# Patient Record
Sex: Female | Born: 1985 | Race: White | Hispanic: No | Marital: Single | Smoking: Former smoker
Health system: Southern US, Community
[De-identification: ages and names within clinical notes are randomized; demographics above are authoritative.]

## PROBLEM LIST (undated history)

## (undated) DIAGNOSIS — E282 Polycystic ovarian syndrome: Secondary | ICD-10-CM

## (undated) DIAGNOSIS — N926 Irregular menstruation, unspecified: Secondary | ICD-10-CM

## (undated) HISTORY — DX: Irregular menstruation, unspecified: N92.6

## (undated) HISTORY — DX: Polycystic ovarian syndrome: E28.2

---
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
## 2008-04-07 ENCOUNTER — Emergency Department (HOSPITAL_COMMUNITY): Admission: EM | Admit: 2008-04-07 | Discharge: 2008-04-07 | Payer: Self-pay | Admitting: Emergency Medicine

## 2008-09-01 ENCOUNTER — Emergency Department (HOSPITAL_COMMUNITY): Admission: EM | Admit: 2008-09-01 | Discharge: 2008-09-01 | Payer: Self-pay | Admitting: Emergency Medicine

## 2009-03-20 ENCOUNTER — Emergency Department (HOSPITAL_BASED_OUTPATIENT_CLINIC_OR_DEPARTMENT_OTHER): Admission: EM | Admit: 2009-03-20 | Discharge: 2009-03-20 | Payer: Self-pay | Admitting: Emergency Medicine

## 2009-03-20 ENCOUNTER — Ambulatory Visit: Payer: Self-pay | Admitting: Radiology
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
## 2011-04-10 ENCOUNTER — Emergency Department (HOSPITAL_BASED_OUTPATIENT_CLINIC_OR_DEPARTMENT_OTHER)
Admission: EM | Admit: 2011-04-10 | Discharge: 2011-04-10 | Disposition: A | Discharge status: Home / Self Care | Payer: Commercial Managed Care - PPO | Attending: Emergency Medicine | Admitting: Emergency Medicine

## 2011-04-10 DIAGNOSIS — T63461A Toxic effect of venom of wasps, accidental (unintentional), initial encounter: Secondary | ICD-10-CM | POA: Insufficient documentation

## 2011-04-10 DIAGNOSIS — F17200 Nicotine dependence, unspecified, uncomplicated: Secondary | ICD-10-CM | POA: Insufficient documentation

## 2011-04-10 DIAGNOSIS — T6391XA Toxic effect of contact with unspecified venomous animal, accidental (unintentional), initial encounter: Secondary | ICD-10-CM | POA: Insufficient documentation

## 2011-07-28 LAB — PREGNANCY, URINE: Preg Test, Ur: NEGATIVE

## 2011-08-02 LAB — RAPID URINE DRUG SCREEN, HOSP PERFORMED
Barbiturates: NOT DETECTED
Benzodiazepines: NOT DETECTED

## 2011-08-02 LAB — ACETAMINOPHEN LEVEL
Acetaminophen (Tylenol), Serum: 43.6 — ABNORMAL HIGH
Acetaminophen (Tylenol), Serum: 44.3 — ABNORMAL HIGH

## 2011-08-02 LAB — ETHANOL: Alcohol, Ethyl (B): 195 — ABNORMAL HIGH

## 2011-08-02 LAB — POCT I-STAT, CHEM 8
Calcium, Ion: 1.09 — ABNORMAL LOW
Glucose, Bld: 104 — ABNORMAL HIGH
HCT: 46
Hemoglobin: 15.6 — ABNORMAL HIGH
TCO2: 21

## 2011-08-02 LAB — SALICYLATE LEVEL: Salicylate Lvl: 12.3

## 2011-08-02 LAB — POCT PREGNANCY, URINE: Preg Test, Ur: NEGATIVE

## 2011-08-19 ENCOUNTER — Emergency Department (INDEPENDENT_AMBULATORY_CARE_PROVIDER_SITE_OTHER): Payer: Commercial Managed Care - PPO

## 2011-08-19 ENCOUNTER — Encounter: Payer: Self-pay | Admitting: *Deleted

## 2011-08-19 ENCOUNTER — Emergency Department (HOSPITAL_BASED_OUTPATIENT_CLINIC_OR_DEPARTMENT_OTHER)
Admission: EM | Admit: 2011-08-19 | Discharge: 2011-08-20 | Disposition: A | Discharge status: Home / Self Care | Payer: Commercial Managed Care - PPO | Attending: Emergency Medicine | Admitting: Emergency Medicine

## 2011-08-19 DIAGNOSIS — R05 Cough: Secondary | ICD-10-CM | POA: Insufficient documentation

## 2011-08-19 DIAGNOSIS — J189 Pneumonia, unspecified organism: Secondary | ICD-10-CM | POA: Insufficient documentation

## 2011-08-19 DIAGNOSIS — R509 Fever, unspecified: Secondary | ICD-10-CM | POA: Insufficient documentation

## 2011-08-19 DIAGNOSIS — R0602 Shortness of breath: Secondary | ICD-10-CM

## 2011-08-19 DIAGNOSIS — R059 Cough, unspecified: Secondary | ICD-10-CM | POA: Insufficient documentation

## 2011-08-19 MED ORDER — ACETAMINOPHEN 500 MG PO TABS
1000.0000 mg | ORAL_TABLET | Freq: Once | ORAL | Status: AC
  Administered 2011-08-19: 1000 mg via ORAL
  Filled 2011-08-19: qty 2

## 2011-08-19 NOTE — ED Notes (Signed)
Pt presents to ED today with c/o fever and cough that started today.  Pt took robitussin and mucinex at home,  Pt last took ibuprefen aroun 4p today.

## 2011-08-20 MED ORDER — AZITHROMYCIN 250 MG PO TABS
500.0000 mg | ORAL_TABLET | Freq: Once | ORAL | Status: AC
  Administered 2011-08-20: 500 mg via ORAL
  Filled 2011-08-20: qty 2

## 2011-08-20 MED ORDER — IBUPROFEN 800 MG PO TABS
800.0000 mg | ORAL_TABLET | Freq: Once | ORAL | Status: AC
  Administered 2011-08-20: 800 mg via ORAL
  Filled 2011-08-20: qty 1

## 2011-08-20 MED ORDER — AZITHROMYCIN 250 MG PO TABS: 250.0000 mg | ORAL_TABLET | Freq: Every day | ORAL | Status: AC

## 2011-08-20 NOTE — ED Provider Notes (Signed)
History     CSN: [id number] Arrival date & time: 08/19/2011 11:20 PM   First MD Initiated Contact with Patient 08/20/11 0005      Chief Complaint  Patient presents with  . Fever  . Cough    (Consider location/radiation/quality/duration/timing/severity/associated sxs/prior treatment) Patient is a 25 y.o. female presenting with fever and cough. The history is provided by the patient.  Fever Primary symptoms of the febrile illness include fever, cough and myalgias. Primary symptoms do not include wheezing, shortness of breath, abdominal pain, nausea, vomiting, diarrhea, dysuria or rash. The current episode started yesterday. This is a new problem. The problem has been gradually worsening.  The fever began yesterday. The maximum temperature recorded prior to her arrival was 103 to 104 F. The temperature was taken by an oral thermometer.  The cough is dry and hacking.  Cough Associated symptoms include myalgias. Pertinent negatives include no shortness of breath and no wheezing.    History reviewed. No pertinent past medical history.  History reviewed. No pertinent past surgical history.  No family history on file.  History  Substance Use Topics  . Smoking status: Never Smoker   . Smokeless tobacco: Not on file  . Alcohol Use: No    OB History    Grav Para Term Preterm Abortions TAB SAB Ect Mult Living                  Review of Systems  Constitutional: Positive for fever.  Respiratory: Positive for cough. Negative for shortness of breath and wheezing.   Gastrointestinal: Negative for nausea, vomiting, abdominal pain and diarrhea.  Genitourinary: Negative for dysuria.  Musculoskeletal: Positive for myalgias.  Skin: Negative for rash.  All other systems reviewed and are negative.    Allergies  Review of patient's allergies indicates no known allergies.  Home Medications   Current Outpatient Rx  Name Route Sig Dispense Refill  . AZITHROMYCIN 250 MG PO TABS Oral  Take 1 tablet (250 mg total) by mouth daily. 4 tablet 0    BP 108/54  Pulse 110  Temp(Src) 102.7 F (39.3 C) (Oral)  Resp 18  Ht 5\' 4"  (1.626 m)  Wt 200 lb (90.719 kg)  BMI 34.33 kg/m2  SpO2 95%  LMP 08/17/2011  Physical Exam  Nursing note and vitals reviewed. Constitutional: She is oriented to person, place, and time. She appears well-developed and well-nourished. No distress.  HENT:  Head: Normocephalic and atraumatic.  Eyes: EOM are normal. Pupils are equal, round, and reactive to light.  Cardiovascular: Regular rhythm, normal heart sounds and intact distal pulses.  Tachycardia present.  Exam reveals no friction rub.   No murmur heard. Pulmonary/Chest: Breath sounds normal. No respiratory distress. She has no wheezes. She has no rales. She exhibits no tenderness.  Abdominal: Soft. Bowel sounds are normal. She exhibits no distension. There is no tenderness. There is no rebound and no guarding.  Musculoskeletal: Normal range of motion. She exhibits no tenderness.       No edema  Neurological: She is alert and oriented to person, place, and time. No cranial nerve deficit.  Skin: Skin is warm and dry. No rash noted.  Psychiatric: She has a normal mood and affect. Her behavior is normal.    ED Course  Procedures (including critical care time)  Labs Reviewed - No data to display Dg Chest 2 View  08/20/2011  *RADIOLOGY REPORT*  Clinical Data: Cough.  Shortness of breath.  Fever.  CHEST - 2 VIEW  Comparison:  03/20/2009  Findings: Retrocardiac airspace opacity is suspicious for pneumonia.  Low lung volumes noted.  The right lung appears clear. Cardiac and mediastinal contours appear unremarkable.  IMPRESSION:  1.  Left lower lobe airspace opacity, compatible with pneumonia.  Original Report Authenticated By: Dellia Cloud, M.D.     1. Pneumonia       MDM   Pt with high fever and cough but stable VS and O2 in the mid 90's.  No sick contacts and no medical problems.  Pt  given tylenol and ibuprofen for fever which improved fever.  Tolerating po's and CXR showed PNA.  Pt given azithro and given f/u instructions.  Denies SOB.        Gwyneth Sprout, MD 08/20/11 [phone]

## 2012-03-29 IMAGING — CR DG CHEST 2V
2 series · 2 of 2 positions shown · non-contrast
Comparison: 03/20/2009

CLINICAL DATA: Cough.  Shortness of breath.  Fever.

CHEST - 2 VIEW

[w chest pa]
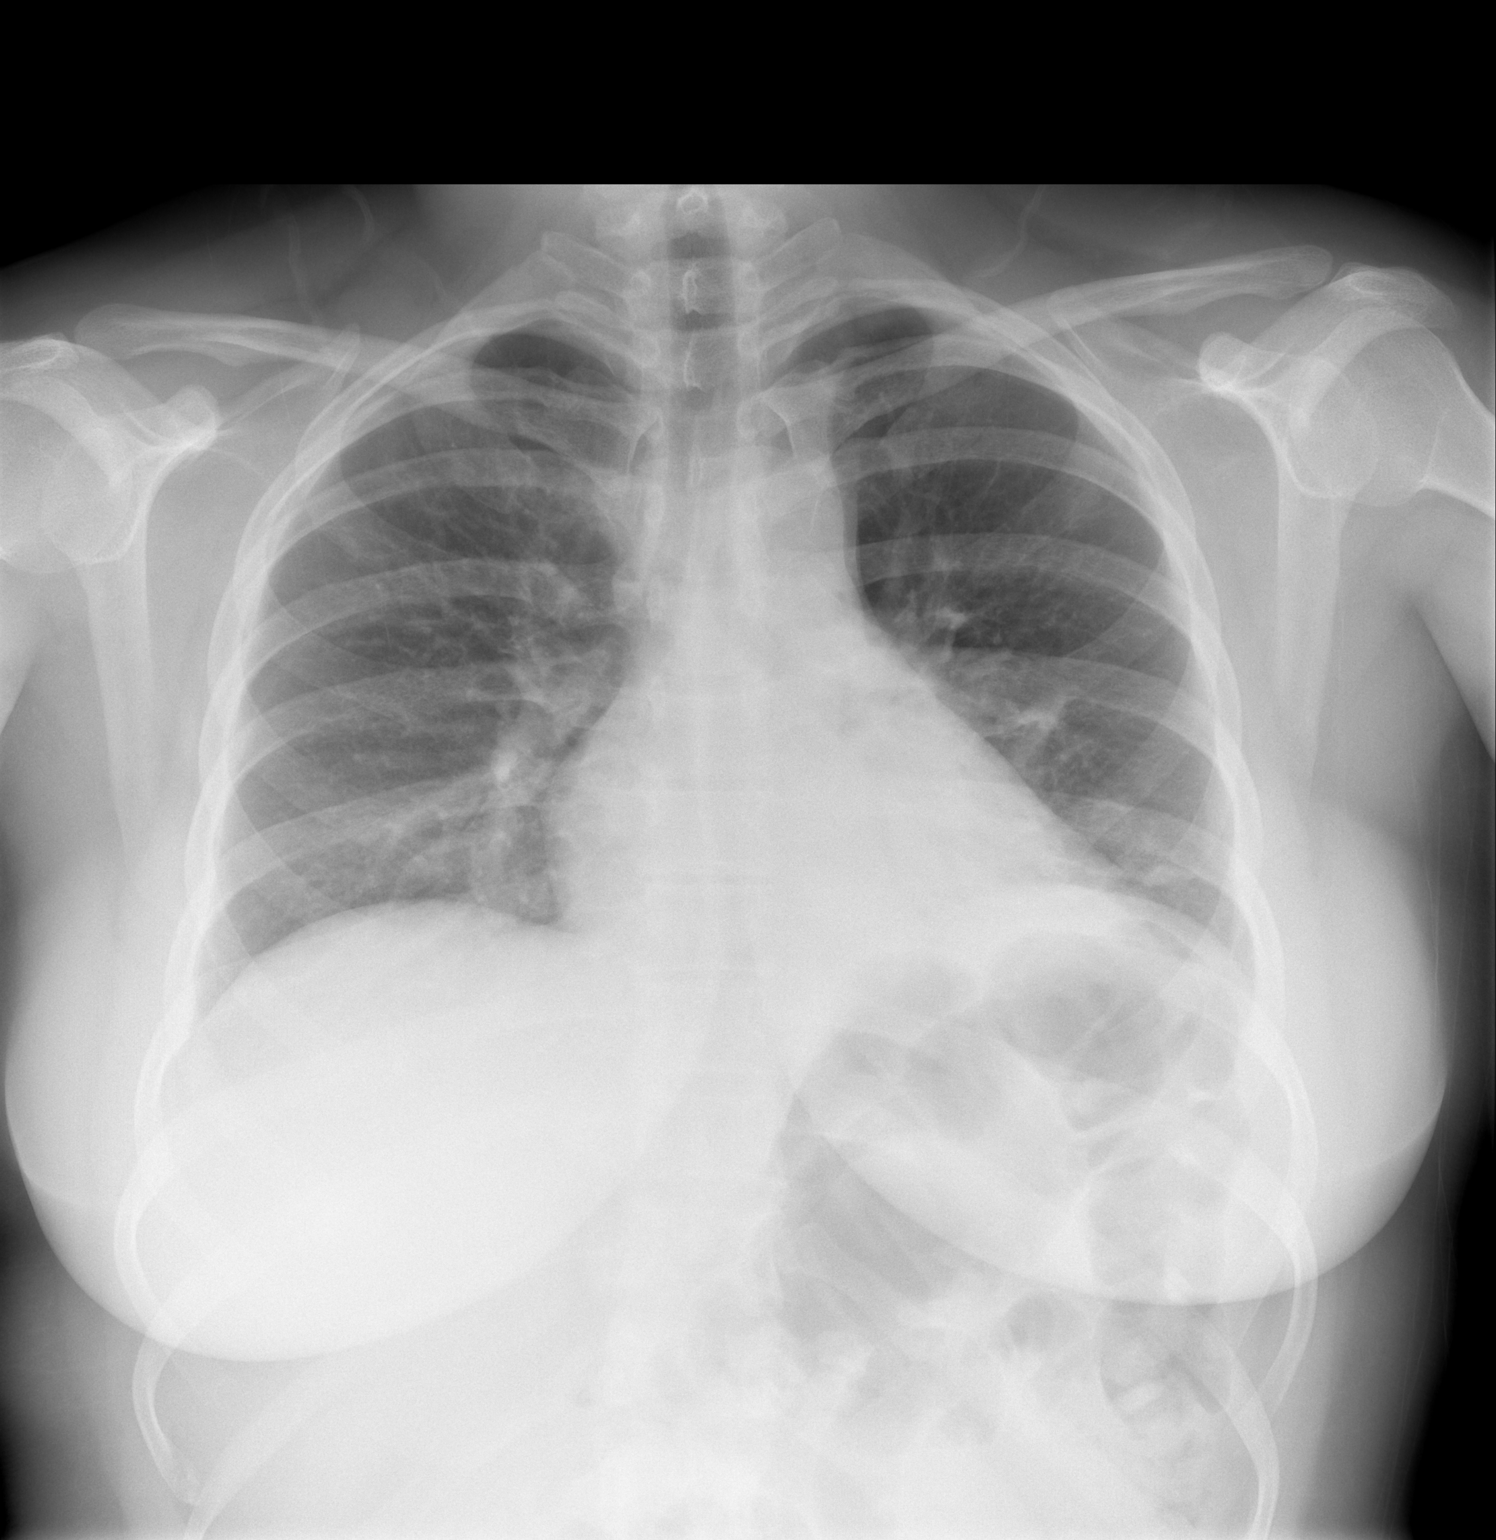

[w chest lat]
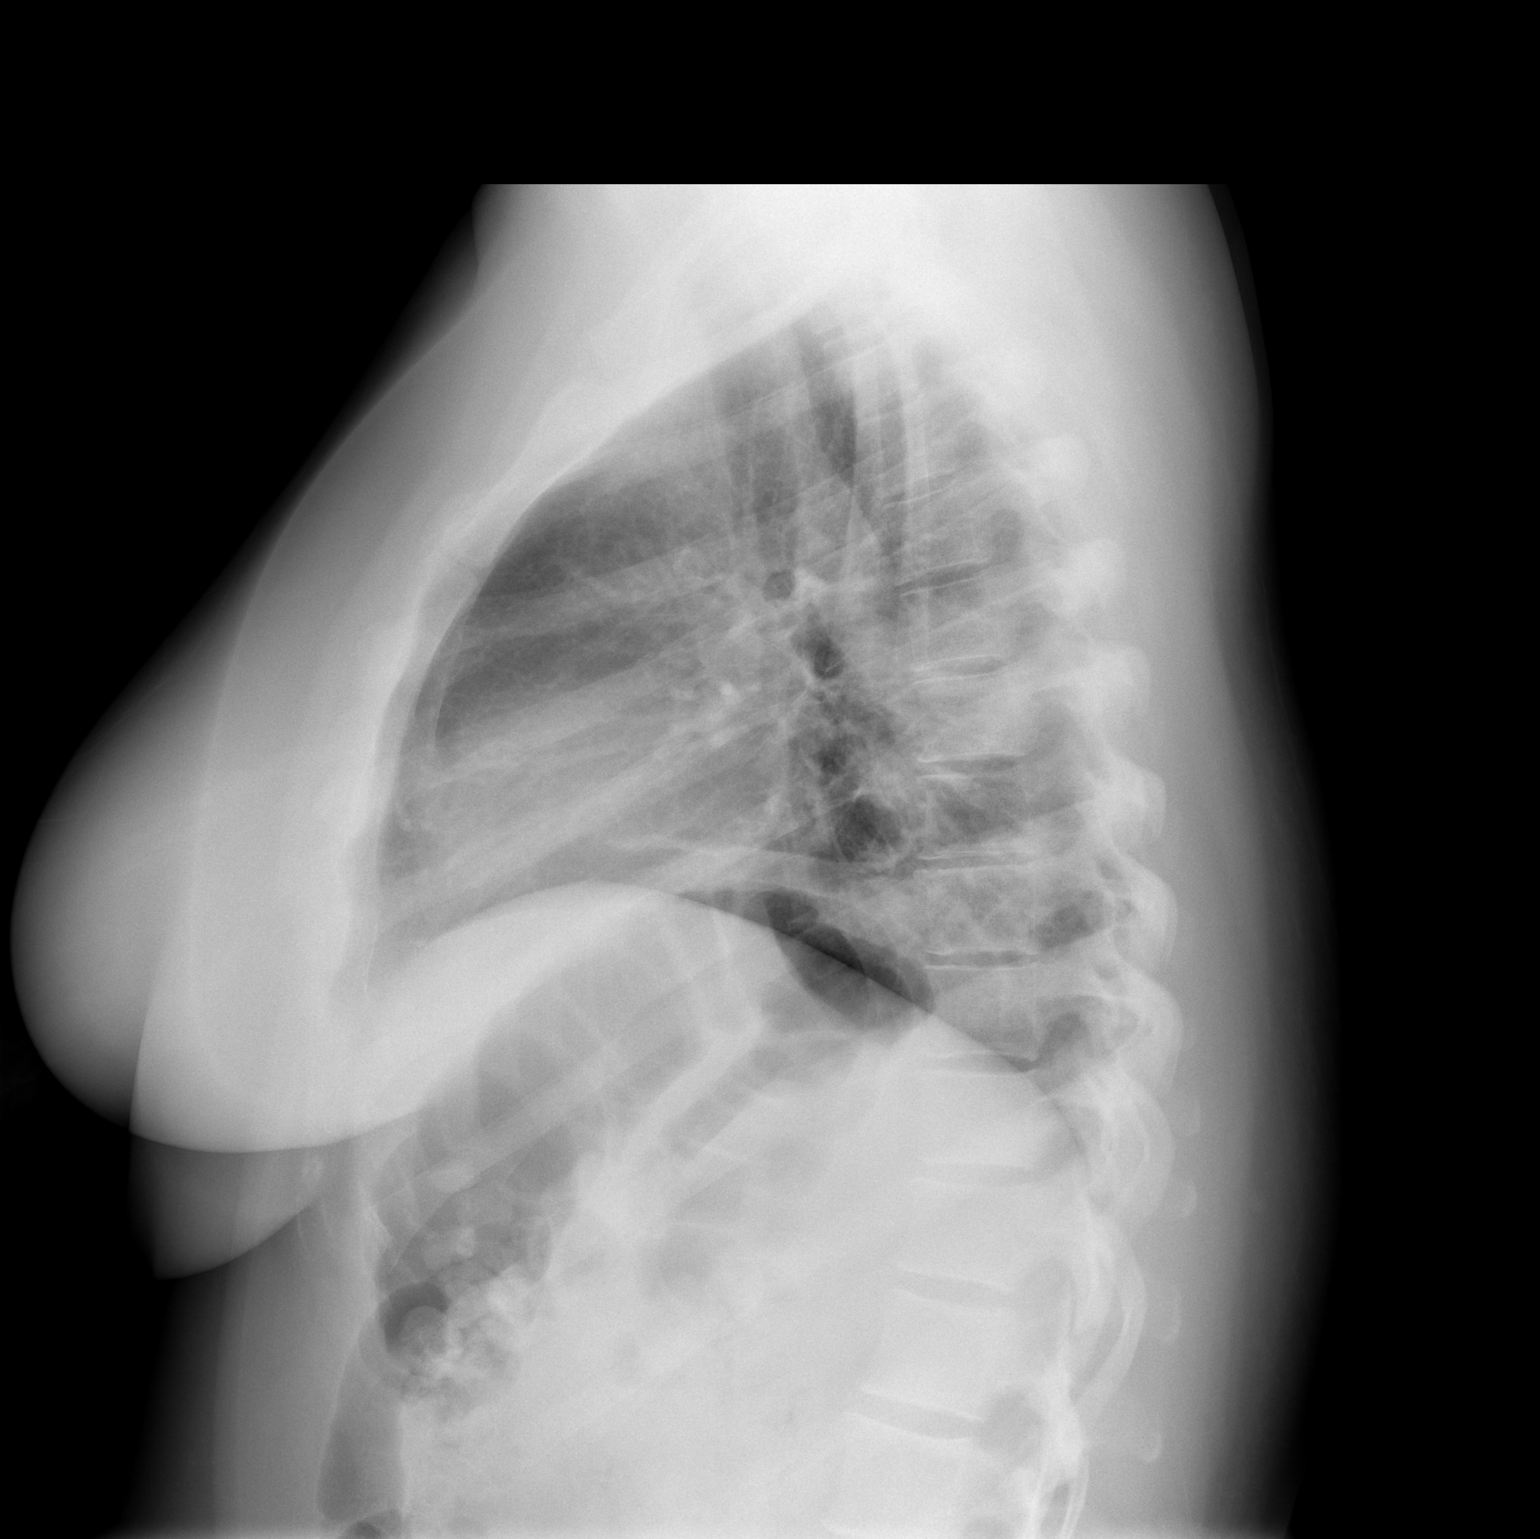

[2 of 2 positions shown; findings below may reference images not displayed]

FINDINGS: Retrocardiac airspace opacity is suspicious for
pneumonia.

Low lung volumes noted.  The right lung appears clear. Cardiac and
mediastinal contours appear unremarkable.
IMPRESSION: 1.  Left lower lobe airspace opacity, compatible with pneumonia.

## 2013-12-04 ENCOUNTER — Encounter (HOSPITAL_COMMUNITY): Payer: Self-pay | Admitting: Emergency Medicine

## 2013-12-04 ENCOUNTER — Emergency Department (HOSPITAL_COMMUNITY)
Admission: EM | Admit: 2013-12-04 | Discharge: 2013-12-04 | Disposition: A | Discharge status: Home / Self Care | Payer: No Typology Code available for payment source | Attending: Emergency Medicine | Admitting: Emergency Medicine

## 2013-12-04 DIAGNOSIS — IMO0002 Reserved for concepts with insufficient information to code with codable children: Secondary | ICD-10-CM

## 2013-12-04 DIAGNOSIS — T7421XA Adult sexual abuse, confirmed, initial encounter: Secondary | ICD-10-CM | POA: Insufficient documentation
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
## 2013-12-04 NOTE — ED Notes (Signed)
SANE nurse at bedside.
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
## 2013-12-04 NOTE — Discharge Instructions (Signed)
Sexual Assault or Rape °Sexual assault is any sexual activity that a person is forced, threatened, or coerced into participating in. It may or may not involve physical contact. You are being sexually abused if you are forced to have sexual contact of any kind. Sexual assault is called rape if penetration has occurred (vaginal, oral, or anal). Many times, sexual assaults are committed by a friend, relative, or associate. Sexual assault and rape are never the victim's fault.  °Sexual assault can result in various health problems for the person who was assaulted. Some of these problems include: °· Physical injuries in the genital area or other areas of the body. °· Risk of unwanted pregnancy. °· Risk of sexually transmitted infections (STIs). °· Psychological problems such as anxiety, depression, or posttraumatic stress disorder. °WHAT STEPS SHOULD BE TAKEN AFTER A SEXUAL ASSAULT? °If you have been sexually assaulted, you should take the following steps as soon as possible: °· Go to a safe area as quickly as possible and call your local emergency services (911 in [place] Get away from the area where you have been attacked.   °· Do not wash, shower, comb your hair, or clean any part of your body.   °· Do not change your clothes.   °· Do not remove or touch anything in the area where you were assaulted.   °· Go to an emergency room for a complete physical exam. Get the necessary tests to protect yourself from STIs or pregnancy. You may be treated for an STI even if no signs of one are present. Emergency contraceptive medicines are also available to help prevent pregnancy, if this is desired. You may need to be examined by a specially trained health care provider. °· Have the health care provider collect evidence during the exam, even if you are not sure if you will file a report with the police. °· Find out how to file the correct papers with the authorities. This is important for all assaults, even if they were committed  by a family member or friend. °· Find out where you can get additional help and support, such as a local rape crisis center. °· Follow up with your health care provider as directed.   °HOW CAN YOU REDUCE THE CHANCES OF SEXUAL ASSAULT? °Take the following steps to help reduce your chances of being sexually assaulted: °· Consider carrying mace or pepper spray for protection against an attacker.   °· Consider taking a self-defense course. °· Do not try to fight off an attacker if he or she has a gun or knife.   °· Be aware of your surroundings, what is happening around you, and who might be there.   °· Be assertive, trust your instincts, and walk with confidence and direction. °· Be careful not to drink too much alcohol or use other intoxicants. These can reduce your ability to fight off an assault. °· Always lock your doors and windows. Be sure to have high-quality locks for your home.   °· Do not let people enter your house if you do not know them.   °· Get a home security system that has a siren if you are able.   °· Protect the keys to your house and car. Do not lend them out. Do not put your name and address on them. If you lose them, get your locks changed.   °· Always lock your car and have your key ready to open the door before approaching the car.   °· Park in a well-lit and busy area. °· Plan your driving routes   so that you travel on well-lit and frequently used streets.  °· Keep your car serviced. Always have at least half a tank of gas in it.   °· Do not go into isolated areas alone. This includes open garages, empty buildings or offices, or public laundry rooms.   °· Do not walk or jog alone, especially when it is dark.   °· Never hitchhike.   °· If your car breaks down, call the police for help on your cell phone and stay inside the car with your doors locked and windows up.   °· If you are being followed, go to a busy area and call for help.   °· If you are stopped by a police officer, especially one in  an unmarked police car, keep your door locked. Do not put your window down all the way. Ask the officer to show you identification first.   °· Be aware of "date rape drugs" that can be placed in a drink when you are not looking. These drugs can make you unable to fight off an assault. °FOR MORE INFORMATION °· Office on Women's Health, [place]. Department of Health and Human Services: www.womenshealth.gov/violence-against-women/types-of-violence/sexual-assault-and-abuse.html °· National Sexual Assault Hotline: 1-800-656-HOPE (4673) °· National Domestic Violence Hotline: 1-800-799-SAFE (7233) or www.thehotline.org °Document Released: 10/14/2000 Document Revised: 06/19/2013 Document Reviewed: 03/20/2013 °ExitCare® Patient Information ©2014 [employer]. ° °

## 2015-09-28 ENCOUNTER — Ambulatory Visit: Payer: 59 | Admitting: Nurse Practitioner

## 2015-09-29 ENCOUNTER — Ambulatory Visit: Payer: 59 | Admitting: Nurse Practitioner

## 2015-12-31 ENCOUNTER — Ambulatory Visit (INDEPENDENT_AMBULATORY_CARE_PROVIDER_SITE_OTHER): Payer: BLUE CROSS/BLUE SHIELD | Admitting: Obstetrics and Gynecology

## 2015-12-31 ENCOUNTER — Encounter: Payer: Self-pay | Admitting: Obstetrics and Gynecology

## 2015-12-31 VITALS — BP 118/83 | HR 80 | Ht 64.0 in | Wt 247.4 lb

## 2015-12-31 DIAGNOSIS — R300 Dysuria: Secondary | ICD-10-CM

## 2015-12-31 DIAGNOSIS — E282 Polycystic ovarian syndrome: Secondary | ICD-10-CM | POA: Insufficient documentation

## 2015-12-31 LAB — POCT URINALYSIS DIPSTICK
Bilirubin, UA: NEGATIVE
GLUCOSE UA: NEGATIVE
Ketones, UA: NEGATIVE
NITRITE UA: NEGATIVE
PROTEIN UA: NEGATIVE
SPEC GRAV UA: 1.01
Urobilinogen, UA: 0.2
pH, UA: 6

## 2015-12-31 MED ORDER — METFORMIN HCL 500 MG PO TABS: 500.0000 mg | ORAL_TABLET | Freq: Two times a day (BID) | ORAL | Status: DC

## 2015-12-31 MED ORDER — METFORMIN HCL 850 MG PO TABS: 850.0000 mg | ORAL_TABLET | Freq: Two times a day (BID) | ORAL | Status: DC

## 2015-12-31 MED ORDER — CYANOCOBALAMIN 1000 MCG/ML IJ SOLN: 1000.0000 ug | Freq: Once | INTRAMUSCULAR | Status: DC

## 2015-12-31 MED ORDER — DROSPIRENONE-ETHINYL ESTRADIOL 3-0.03 MG PO TABS: 1.0000 | ORAL_TABLET | Freq: Every day | ORAL | Status: DC

## 2015-12-31 MED ORDER — PHENTERMINE HCL 37.5 MG PO TABS: 37.5000 mg | ORAL_TABLET | Freq: Every day | ORAL | Status: DC

## 2015-12-31 NOTE — Progress Notes (Unsigned)
Subjective:  Shantil E Dejarnett is a 30 y.o. G1P0010 at Unknown being seen today for weight loss management- initial visit.  Patient reports General ROS: {rosgen:310653} and reports previous weight loss attempts: Management changes made at the last visit include:  adding medication, stopping medication, changing medication dose and ordering test(s).  Onset was sudden/gradua,  months/year(s) ago.  Onset followed:  starting medication, change in living environment, change in affect, recent pregnancy, and mental status changes. Associated symptoms include: fatigue, depression, anxiety, greasy stools, abdominal pain, polydipsia, headaches, change in clothing fit and menstrual changes. Previous/Current treatment includes: small frequent feedings, nutritional supplement, vitamin supplement, gluten free diet, psychiatrist care, antidepressant, vitamin B-12 injections and appetite stimulant.  Pertinent medical history includes: chronic digestive disease, diabetes, eating disorder, anxiety and psychiatric illness.  Risk factors include: social isolation, poverty, alcoholism, illicit drug use, excessive exercise, poor dentition and new medication.  The patient has a surgical history of: thyroidectomy, gastrectomy, bariatric surgery, cholecystectomy and hysterectomy.  Pertinent social history includes: alcohol abuse, tobacco abuse and marijuana use. Past evaluation has included: metabolic profile, hemoglobin A1c, thyroid panel  and psychiatric evaluation.  Past treatment has included: small frequent feedings, nutritional supplement, vitamin supplement, social assistance, psychiatrist care, antidepressant, vitamin B-12 injections, appetite stimulant, exercise management and discontinuation of medication.  The following portions of the patient's history were reviewed and updated as appropriate: allergies, current medications, past family history, past medical history, past social history, past surgical history and  problem list.   Objective:   Filed Vitals:   12/31/15 1555  BP: 118/83  Pulse: 80  Height:  (1.626 m)  Weight: 247 lb 6.4 oz (112.22 kg)    General:  Alert, oriented and cooperative. Patient is in no acute distress.  :   :   :   :   :   :   PE: Well groomed female in no current distress,   Mental Status: Normal mood and affect. Normal behavior. Normal judgment and thought content.   Current BMI: Body mass index is 42.45 kg/(m^2).   Assessment and Plan:  Obesity  1. Dysuria *** - POCT urinalysis dipstick - Urine culture  2. PCOS (polycystic ovarian syndrome) ***  3. Morbid obesity, unspecified obesity type (HCC) ***   Plan: low carb, High protein diet RX for adipex 37.5 mg daily and B12 .ml monthly, to start now with first injection given at today's visit. Reviewed side-effects common to both medications and expected outcomes. Increase daily water intake to at least 8 bottle a day, every day.  Goal is to reduse weight by 10% by end of three months, and will re-evaluate then.  RTC in 4 weeks for Nurse visit to check weight & BP, and get next B12 injections.    Please refer to After Visit Summary for other counseling recommendations.    Kendalyn Cranfield N [place], CNM   Charvis Lightner [employer], CNM      Consider the Low Glycemic Index Diet and 6 smaller meals daily .  This boosts your metabolism and regulates your sugars:   Use the protein bar by [employer] because they have lots of fiber in them  Find the low carb flatbreads, tortillas and pita breads for sandwiches:  Joseph's makes a pita bread and a flat bread , available at [facility] and [employer]; Toufayah makes a low carb flatbread available at [employer] and [employer] that is 9 net carbs and 100 cal Mission makes a low carb whole wheat tortilla available at [employer] most  grocery stores with 6 net carbs and 210 cal  [place] yogurt can still have a lot of carbs .  Dannon Light N fit has 80 cal and 8 carbs

## 2016-01-03 LAB — URINE CULTURE

## 2016-01-05 ENCOUNTER — Other Ambulatory Visit: Payer: Self-pay | Admitting: Obstetrics and Gynecology

## 2016-01-05 MED ORDER — NITROFURANTOIN MONOHYD MACRO 100 MG PO CAPS: 100.0000 mg | ORAL_CAPSULE | Freq: Two times a day (BID) | ORAL | Status: DC

## 2016-01-18 ENCOUNTER — Telehealth: Payer: Self-pay | Admitting: *Deleted

## 2016-01-18 NOTE — Telephone Encounter (Signed)
-----   Message from Purcell NailsMelody N Shambley, [place] sent at 01/05/2016  3:07 PM EST ----- Please let her know urine culture results, and that I sent in a prescription.

## 2016-01-18 NOTE — Telephone Encounter (Signed)
Notified pt of results 

## 2016-02-01 ENCOUNTER — Ambulatory Visit: Payer: BLUE CROSS/BLUE SHIELD

## 2016-02-01 ENCOUNTER — Ambulatory Visit (INDEPENDENT_AMBULATORY_CARE_PROVIDER_SITE_OTHER): Payer: BLUE CROSS/BLUE SHIELD | Admitting: Obstetrics and Gynecology

## 2016-02-01 VITALS — BP 118/84 | HR 98 | Wt 241.0 lb

## 2016-02-01 DIAGNOSIS — E669 Obesity, unspecified: Secondary | ICD-10-CM

## 2016-02-01 MED ORDER — CYANOCOBALAMIN 1000 MCG/ML IJ SOLN
1000.0000 ug | Freq: Once | INTRAMUSCULAR | Status: AC
  Administered 2016-02-01: 1000 ug via INTRAMUSCULAR

## 2016-02-01 NOTE — Progress Notes (Signed)
Pt is here for wt, bp check, b-12 inj She is doing well on medication, she is only taking 1/2    12/31/15 wt-247lb

## 2016-02-08 ENCOUNTER — Telehealth: Payer: Self-pay | Admitting: Obstetrics and Gynecology

## 2016-02-08 NOTE — Telephone Encounter (Signed)
Pt has been able to take a whole phentermine and needs a refill/ pt had terrible cramps and has periods regularlly bur for 11 days, she had planned a cruise 1 yr ago and wants to know and her period is coming during that week. Is there something she can take to keep her from having a period.

## 2016-02-08 NOTE — Telephone Encounter (Signed)
pls advise

## 2016-02-09 ENCOUNTER — Other Ambulatory Visit: Payer: Self-pay | Admitting: Obstetrics and Gynecology

## 2016-02-09 MED ORDER — ETONOGESTREL-ETHINYL ESTRADIOL 0.12-0.015 MG/24HR VA RING: VAGINAL_RING | VAGINAL | Status: DC

## 2016-02-09 NOTE — Telephone Encounter (Signed)
Called patient- switched to nuvaring and instructed on use- rx sent in.

## 2016-02-26 ENCOUNTER — Telehealth: Payer: Self-pay | Admitting: Obstetrics and Gynecology

## 2016-02-26 NOTE — Telephone Encounter (Signed)
pls advise

## 2016-02-26 NOTE — Telephone Encounter (Signed)
PT HAS NUVA RING FOR 3 WKS AND IS VERY MOODY. sHE IS VERY UPTIGHT, NO PATIENCE. IF SHE TAKES IT OUT HOW LONG WILL IT TAKE THE HORMONES TO GET OUT OF HER BODY.

## 2016-02-28 NOTE — Telephone Encounter (Signed)
2-3 days

## 2016-02-29 NOTE — Telephone Encounter (Signed)
Left detailed message for pt 

## 2016-09-16 ENCOUNTER — Encounter: Payer: Self-pay | Admitting: Obstetrics and Gynecology

## 2016-09-16 ENCOUNTER — Ambulatory Visit (INDEPENDENT_AMBULATORY_CARE_PROVIDER_SITE_OTHER): Payer: BLUE CROSS/BLUE SHIELD | Admitting: Obstetrics and Gynecology

## 2016-09-16 ENCOUNTER — Other Ambulatory Visit: Payer: Self-pay | Admitting: Obstetrics and Gynecology

## 2016-09-16 VITALS — BP 115/80 | HR 72 | Ht 64.0 in | Wt 250.6 lb

## 2016-09-16 DIAGNOSIS — N946 Dysmenorrhea, unspecified: Secondary | ICD-10-CM

## 2016-09-16 DIAGNOSIS — Z01419 Encounter for gynecological examination (general) (routine) without abnormal findings: Secondary | ICD-10-CM

## 2016-09-16 DIAGNOSIS — E282 Polycystic ovarian syndrome: Secondary | ICD-10-CM

## 2016-09-16 DIAGNOSIS — Z23 Encounter for immunization: Secondary | ICD-10-CM | POA: Diagnosis not present

## 2016-09-16 MED ORDER — LEVONORGEST-ETH ESTRAD 91-DAY 0.1-0.02 & 0.01 MG PO TABS: 1.0000 | ORAL_TABLET | Freq: Every day | ORAL | 4 refills | Status: DC

## 2016-09-16 NOTE — Progress Notes (Signed)
Subjective:   Shari Guzman is a 30 y.o. 861P0010 Caucasian female here for a routine well-woman exam.  Patient's last menstrual period was 09/09/2016.    Current complaints: Painful cramping with periods  States for the past 3 months she has noticed she has had severe cramping at the end of her period with heavy bleeding. Worse this last month and remained uncontrolled with acetaminophen and ibuprofen. Had to take a hydrocodone to feel better. Periods come monthly and last approximately 4 days. Day 1 and 4 are the heaviest with bleeding. Not currently using any contraception but is still taking Metformin for . Tried Nuvaring back in March and didn't like it because it made her feel more stressed out and mean.  Does not exercise regularly or follow a healthy diet. States she has been out of town approximately 15 times over the past several months with her disabled brother and has eaten out the majority of the time.  PCP: [facility]       Does desire labs & Flu vaccine TDAP UTD  Had genetic testing for breast and ovarian cancer and was not predisposed to it.  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  Past Medical History Past Medical History:  Diagnosis Date  . Irregular periods   . PCOS (polycystic ovarian syndrome)     Past Surgical History History reviewed. No pertinent surgical history.  Gynecologic History G1P0010  Patient's last menstrual period was 09/09/2016. Contraception: none Last Pap: ?Marland Kitchen.  Results were: abnormal but follow up was normal   Obstetric History OB History  Gravida Para Term Preterm AB Living  1       1    SAB TAB Ectopic Multiple Live Births    1          # Outcome Date GA Lbr Len/2nd Weight Sex Delivery Anes PTL Lv  1 TAB 2010              Current Medications Current Outpatient Prescriptions on File Prior to Visit  Medication Sig Dispense Refill  . ibuprofen (ADVIL,MOTRIN) 200 MG tablet Take 400 mg by mouth every 6 (six) hours as needed for cramping.    . metFORMIN (GLUCOPHAGE) 500 MG tablet Take 1 tablet (500 mg total) by mouth 2 (two) times daily with a meal. 30 tablet 0  . cyanocobalamin (,VITAMIN B-12,) 1000 MCG/ML injection Inject 1 mL (1,000 mcg total) into the muscle once. (Patient not taking: Reported on 09/16/2016) 3 mL 1  . etonogestrel-ethinyl estradiol (NUVARING) 0.12-0.015 MG/24HR vaginal ring Insert vaginally and leave in place for 3 consecutive weeks, then remove for 1 week. (Patient not taking: Reported on 09/16/2016) 1 each 12  . metFORMIN (GLUCOPHAGE) 850 MG tablet Take 1 tablet (850 mg total) by mouth 2 (two) times daily with a meal. (Patient not taking: Reported on 09/16/2016) 60 tablet 6  . phentermine (ADIPEX-P) 37.5 MG tablet Take 1 tablet (37.5 mg total) by mouth daily before breakfast. Reported on 12/31/2015 (Patient not taking: Reported on 09/16/2016) 30 tablet 2   No current facility-administered medications on file prior to visit.  Review of Systems Patient denies any headaches, blurred vision, shortness of breath, chest pain, abdominal pain, problems with bowel movements, urination, or intercourse.  Objective:  BP 115/80   Pulse 72   Ht 5\' 4"  (1.626 m)   Wt 250 lb 9.6 oz (113.7 kg)   LMP 09/09/2016   BMI 43.02 kg/m  Physical Exam  General:  Well developed, well nourished, no acute distress. She is alert and oriented x3. Skin:  Warm and dry Neck:  Midline trachea, no thyromegaly or nodules Cardiovascular: Regular rate  and rhythm, no murmur heard Lungs:  Effort normal, all lung fields clear to auscultation bilaterally Breasts:  No dominant palpable mass, retraction, or nipple discharge Abdomen:  Soft, non tender, no hepatosplenomegaly or masses Pelvic:  External genitalia is normal in appearance.  The vagina is normal in appearance. The cervix is bulbous, no CMT.  Thin prep pap is done with HR HPV cotesting. Uterus is felt to be normal size, shape, and contour.  No adnexal masses or tenderness noted. Extremities:  No swelling or varicosities noted Psych:  She has a normal mood and affect  Assessment:   Healthy well-woman exam Dysmenorrhea Obesity Need for flu vaccine  Plan:  Will send in prescription for Loseasonique to help control periods and cramping. Labs drawn- will follow up and manage accordingly. Discussed weight management and patient would like to follow up and start program. Discussed using Aleve for cramping to see if that helps better than Motrin. Flu vaccine give. F/U in 1 week for weight management F/U in 1 year for AE or sooner if needed.  Smiley HousemanMelissa Vandell Kun, RN, Student FNP Melody Suzan NailerN Shambley, CNM

## 2016-09-16 NOTE — Patient Instructions (Signed)
Place annual gynecologic exam patient instructions here.

## 2016-09-19 LAB — CYTOLOGY - PAP

## 2016-09-21 LAB — COMPREHENSIVE METABOLIC PANEL
ALT: 32 IU/L (ref 0–32)
AST: 16 IU/L (ref 0–40)
Albumin/Globulin Ratio: 1.5 (ref 1.2–2.2)
Albumin: 4.3 g/dL (ref 3.5–5.5)
Alkaline Phosphatase: 74 IU/L (ref 39–117)
BUN/Creatinine Ratio: 15 (ref 9–23)
BUN: 12 mg/dL (ref 6–20)
Bilirubin Total: 0.3 mg/dL (ref 0.0–1.2)
CALCIUM: 9.1 mg/dL (ref 8.7–10.2)
CO2: 22 mmol/L (ref 18–29)
CREATININE: 0.82 mg/dL (ref 0.57–1.00)
Chloride: 100 mmol/L (ref 96–106)
GFR calc Af Amer: 111 mL/min/{1.73_m2} (ref >59)
GFR, EST NON AFRICAN AMERICAN: 96 mL/min/{1.73_m2} (ref >59)
GLOBULIN, TOTAL: 2.8 g/dL (ref 1.5–4.5)
Glucose: 102 mg/dL — ABNORMAL HIGH (ref 65–99)
Potassium: 4.3 mmol/L (ref 3.5–5.2)
SODIUM: 141 mmol/L (ref 134–144)
TOTAL PROTEIN: 7.1 g/dL (ref 6.0–8.5)

## 2016-09-21 LAB — CBC
HEMOGLOBIN: 13.6 g/dL (ref 11.1–15.9)
Hematocrit: 41.1 % (ref 34.0–46.6)
MCH: 30.5 pg (ref 26.6–33.0)
MCHC: 33.1 g/dL (ref 31.5–35.7)
MCV: 92 fL (ref 79–97)
PLATELETS: 343 10*3/uL (ref 150–379)
RBC: 4.46 x10E6/uL (ref 3.77–5.28)
RDW: 14.1 % (ref 12.3–15.4)
WBC: 9.5 10*3/uL (ref 3.4–10.8)

## 2016-09-21 LAB — LIPID PANEL
CHOLESTEROL TOTAL: 202 mg/dL — AB (ref 100–199)
Chol/HDL Ratio: 4.2 ratio units (ref 0.0–4.4)
HDL: 48 mg/dL (ref >39)
LDL CALC: 127 mg/dL — AB (ref 0–99)
Triglycerides: 135 mg/dL (ref 0–149)
VLDL Cholesterol Cal: 27 mg/dL (ref 5–40)

## 2016-09-21 LAB — VITAMIN D 25 HYDROXY (VIT D DEFICIENCY, FRACTURES): Vit D, 25-Hydroxy: 27.6 ng/mL — ABNORMAL LOW (ref 30.0–100.0)

## 2016-09-21 LAB — TESTOSTERONE, FREE, TOTAL, SHBG
Sex Hormone Binding: 26.2 nmol/L (ref 24.6–122.0)
TESTOSTERONE: 46 ng/dL (ref 8–48)
Testosterone, Free: 2 pg/mL (ref 0.0–4.2)

## 2016-09-21 LAB — DHEA-SULFATE: DHEA-SO4: 245.5 ug/dL (ref 84.8–378.0)

## 2016-09-21 LAB — THYROID PANEL WITH TSH
Free Thyroxine Index: 2.1 (ref 1.2–4.9)
T3 UPTAKE RATIO: 26 % (ref 24–39)
T4 TOTAL: 8.2 ug/dL (ref 4.5–12.0)
TSH: 2.91 u[IU]/mL (ref 0.450–4.500)

## 2016-09-21 LAB — PROGESTERONE: Progesterone: <0.1 ng/mL

## 2016-09-21 LAB — HEMOGLOBIN A1C
ESTIMATED AVERAGE GLUCOSE: 114 mg/dL
Hgb A1c MFr Bld: 5.6 % (ref 4.8–5.6)

## 2016-09-21 LAB — FSH/LH
FSH: 5.8 m[IU]/mL
LH: 5.3 m[IU]/mL

## 2016-09-21 LAB — ESTRADIOL: ESTRADIOL: 45 pg/mL

## 2016-09-21 LAB — INSULIN, RANDOM

## 2016-09-26 ENCOUNTER — Ambulatory Visit: Payer: BLUE CROSS/BLUE SHIELD

## 2016-09-29 ENCOUNTER — Ambulatory Visit (INDEPENDENT_AMBULATORY_CARE_PROVIDER_SITE_OTHER): Payer: BLUE CROSS/BLUE SHIELD | Admitting: Obstetrics and Gynecology

## 2016-09-29 VITALS — BP 108/72 | HR 62 | Ht 64.0 in | Wt 249.5 lb

## 2016-09-29 DIAGNOSIS — E669 Obesity, unspecified: Secondary | ICD-10-CM | POA: Diagnosis not present

## 2016-09-29 DIAGNOSIS — Z6841 Body Mass Index (BMI) 40.0 and over, adult: Secondary | ICD-10-CM | POA: Diagnosis not present

## 2016-09-29 DIAGNOSIS — IMO0001 Reserved for inherently not codable concepts without codable children: Secondary | ICD-10-CM

## 2016-09-29 MED ORDER — CYANOCOBALAMIN 1000 MCG/ML IJ SOLN: 1000.0000 ug | Freq: Once | INTRAMUSCULAR | 1 refills | Status: AC

## 2016-09-29 MED ORDER — CYANOCOBALAMIN 1000 MCG/ML IJ SOLN
1000.0000 ug | Freq: Once | INTRAMUSCULAR | Status: AC
  Administered 2016-09-29: 1000 ug via INTRAMUSCULAR

## 2016-09-29 NOTE — Progress Notes (Signed)
Patient ID: York SpanielMeghan E Bartley, female   DOB: June 27, 1986, 30 y.o.   MRN: [id number]  Pt presents for weight, B/P, B-12 injection.   Weight gain of _5_ lbs. Encouraged eating healthy and exercise. Pt to receive rx for phentermine today and rx for B-12 medication. Was to f/u in 1 wk from 09/16/16 but it has been 2 wks and MNS not in office this week. Sent her B-12 rx to pharmacy (got one in house today). Will get phentermine rx next week when MNS returns.

## 2016-10-04 ENCOUNTER — Encounter: Payer: Self-pay | Admitting: Obstetrics and Gynecology

## 2016-10-05 ENCOUNTER — Other Ambulatory Visit: Payer: Self-pay | Admitting: Obstetrics and Gynecology

## 2016-10-05 MED ORDER — LEVONORGEST-ETH ESTRAD 91-DAY 0.1-0.02 & 0.01 MG PO TABS: 1.0000 | ORAL_TABLET | Freq: Every day | ORAL | 4 refills | Status: DC

## 2016-10-05 MED ORDER — SPIRONOLACTONE 50 MG PO TABS: 50.0000 mg | ORAL_TABLET | Freq: Two times a day (BID) | ORAL | 2 refills | Status: DC

## 2016-10-27 ENCOUNTER — Ambulatory Visit: Payer: BLUE CROSS/BLUE SHIELD

## 2017-09-26 ENCOUNTER — Encounter: Payer: BLUE CROSS/BLUE SHIELD | Admitting: Obstetrics and Gynecology

## 2017-11-14 DIAGNOSIS — K219 Gastro-esophageal reflux disease without esophagitis: Secondary | ICD-10-CM | POA: Insufficient documentation

## 2018-02-22 ENCOUNTER — Encounter: Payer: BLUE CROSS/BLUE SHIELD | Admitting: Obstetrics and Gynecology

## 2018-08-22 ENCOUNTER — Other Ambulatory Visit: Payer: Self-pay | Admitting: Internal Medicine

## 2018-08-22 DIAGNOSIS — G44031 Episodic paroxysmal hemicrania, intractable: Secondary | ICD-10-CM

## 2018-09-25 ENCOUNTER — Ambulatory Visit (INDEPENDENT_AMBULATORY_CARE_PROVIDER_SITE_OTHER): Payer: BLUE CROSS/BLUE SHIELD | Admitting: Obstetrics and Gynecology

## 2018-09-25 ENCOUNTER — Encounter: Payer: Self-pay | Admitting: Obstetrics and Gynecology

## 2018-09-25 VITALS — BP 105/72 | HR 71 | Ht 65.0 in | Wt 270.1 lb

## 2018-09-25 DIAGNOSIS — Z01419 Encounter for gynecological examination (general) (routine) without abnormal findings: Secondary | ICD-10-CM

## 2018-09-25 NOTE — Progress Notes (Signed)
Subjective:   Shari Guzman is a 32 y.o. 251P0010 Caucasian female here for a routine well-woman exam.  No LMP recorded.    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  Past Medical History Past Medical History:  Diagnosis Date  . Irregular periods   . PCOS (polycystic ovarian syndrome)     Past Surgical History History reviewed. No pertinent surgical history.  Gynecologic History G1P0010  No LMP recorded. Contraception: none Last Pap: 2017. Results were: normal   Obstetric History OB History  Gravida Para Term Preterm AB Living  1       1    SAB TAB Ectopic Multiple Live Births    1          # Outcome Date GA Lbr Len/2nd Weight Sex Delivery Anes PTL Lv  1 TAB 2010            Current Medications No current outpatient medications on file prior to visit.   No current facility-administered medications on file prior to visit.     Review of Systems Patient denies any headaches, blurred vision, shortness of breath, chest pain, abdominal pain, problems with bowel movements, urination, or intercourse.  Objective:  BP 105/72   Pulse 71   Ht 5\' 5"  (1.651 m)   Wt 270 lb 1.6 oz (122.5 kg)   BMI 44.95 kg/m  Physical Exam  General:  Well developed, well nourished, no acute distress. She is alert and oriented x3. Skin:  Warm and dry Neck:  Midline trachea, no thyromegaly or nodules Cardiovascular: Regular rate and rhythm, no murmur heard Lungs:  Effort normal, all lung fields clear to auscultation  bilaterally Breasts:  No dominant palpable mass, retraction, or nipple discharge Abdomen:  Soft, non tender, no hepatosplenomegaly or masses Pelvic:  External genitalia is normal in appearance.  The vagina is normal in appearance. The cervix is bulbous, no CMT.  Thin prep pap is not done . Uterus is felt to be normal size, shape, and contour.  No adnexal masses or tenderness noted. Extremities:  No swelling or varicosities noted Psych:  She has a normal mood and affect  Assessment:   Healthy well-woman exam Obesity H/o vit D Defciency  Plan:   F/U 1 year for AE, or sooner if needed   Shari Guzman Shari Guzman, CNM

## 2018-09-25 NOTE — Patient Instructions (Signed)
Preventive Care 18-39 Years, Female Preventive care refers to lifestyle choices and visits with your health care provider that can promote health and wellness. What does preventive care include?  A yearly physical exam. This is also called an annual well check.  Dental exams once or twice a year.  Routine eye exams. Ask your health care provider how often you should have your eyes checked.  Personal lifestyle choices, including: ? Daily care of your teeth and gums. ? Regular physical activity. ? Eating a healthy diet. ? Avoiding tobacco and drug use. ? Limiting alcohol use. ? Practicing safe sex. ? Taking vitamin and mineral supplements as recommended by your health care provider. What happens during an annual well check? The services and screenings done by your health care provider during your annual well check will depend on your age, overall health, lifestyle risk factors, and family history of disease. Counseling Your health care provider may ask you questions about your:  Alcohol use.  Tobacco use.  Drug use.  Emotional well-being.  Home and relationship well-being.  Sexual activity.  Eating habits.  Work and work Statistician.  Method of birth control.  Menstrual cycle.  Pregnancy history.  Screening You may have the following tests or measurements:  Height, weight, and BMI.  Diabetes screening. This is done by checking your blood sugar (glucose) after you have not eaten for a while (fasting).  Blood pressure.  Lipid and cholesterol levels. These may be checked every 5 years starting at age 38.  Skin check.  Hepatitis C blood test.  Hepatitis B blood test.  Sexually transmitted disease (STD) testing.  BRCA-related cancer screening. This may be done if you have a family history of breast, ovarian, tubal, or peritoneal cancers.  Pelvic exam and Pap test. This may be done every 3 years starting at age 38. Starting at age 30, this may be done  every 5 years if you have a Pap test in combination with an HPV test.  Discuss your test results, treatment options, and if necessary, the need for more tests with your health care provider. Vaccines Your health care provider may recommend certain vaccines, such as:  Influenza vaccine. This is recommended every year.  Tetanus, diphtheria, and acellular pertussis (Tdap, Td) vaccine. You may need a Td booster every 10 years.  Varicella vaccine. You may need this if you have not been vaccinated.  HPV vaccine. If you are 39 or younger, you may need three doses over 6 months.  Measles, mumps, and rubella (MMR) vaccine. You may need at least one dose of MMR. You may also need a second dose.  Pneumococcal 13-valent conjugate (PCV13) vaccine. You may need this if you have certain conditions and were not previously vaccinated.  Pneumococcal polysaccharide (PPSV23) vaccine. You may need one or two doses if you smoke cigarettes or if you have certain conditions.  Meningococcal vaccine. One dose is recommended if you are age 68-21 years and a first-year college student living in a residence hall, or if you have one of several medical conditions. You may also need additional booster doses.  Hepatitis A vaccine. You may need this if you have certain conditions or if you travel or work in places where you may be exposed to hepatitis A.  Hepatitis B vaccine. You may need this if you have certain conditions or if you travel or work in places where you may be exposed to hepatitis B.  Haemophilus influenzae type b (Hib) vaccine. You may need this  if you have certain risk factors.  Talk to your health care provider about which screenings and vaccines you need and how often you need them. This information is not intended to replace advice given to you by your health care provider. Make sure you discuss any questions you have with your health care provider. Document Released: 12/13/2001 Document Revised:  07/06/2016 Document Reviewed: 08/18/2015 Elsevier Interactive Patient Education  2018 Elsevier Inc.  

## 2019-05-09 ENCOUNTER — Other Ambulatory Visit: Payer: Self-pay

## 2019-05-09 MED ORDER — FLUCONAZOLE 150 MG PO TABS: 150.0000 mg | ORAL_TABLET | Freq: Once | ORAL | 1 refills | Status: AC

## 2021-02-25 ENCOUNTER — Encounter: Payer: Self-pay | Admitting: Obstetrics and Gynecology

## 2021-02-25 ENCOUNTER — Other Ambulatory Visit (HOSPITAL_COMMUNITY)
Admission: RE | Admit: 2021-02-25 | Discharge: 2021-02-25 | Disposition: A | Discharge status: Home / Self Care | Payer: BC Managed Care – PPO | Source: Ambulatory Visit | Attending: Obstetrics and Gynecology | Admitting: Obstetrics and Gynecology

## 2021-02-25 ENCOUNTER — Other Ambulatory Visit: Payer: Self-pay

## 2021-02-25 ENCOUNTER — Ambulatory Visit (INDEPENDENT_AMBULATORY_CARE_PROVIDER_SITE_OTHER): Payer: BC Managed Care – PPO | Admitting: Obstetrics and Gynecology

## 2021-02-25 VITALS — BP 114/73 | HR 67 | Ht 64.0 in | Wt 273.1 lb

## 2021-02-25 DIAGNOSIS — Z124 Encounter for screening for malignant neoplasm of cervix: Secondary | ICD-10-CM | POA: Diagnosis not present

## 2021-02-25 DIAGNOSIS — Z6841 Body Mass Index (BMI) 40.0 and over, adult: Secondary | ICD-10-CM | POA: Diagnosis not present

## 2021-02-25 DIAGNOSIS — Z01419 Encounter for gynecological examination (general) (routine) without abnormal findings: Secondary | ICD-10-CM | POA: Diagnosis not present

## 2021-02-25 NOTE — Progress Notes (Signed)
HPI:      [name] is a 35 y.o. G1P0010 who LMP was No LMP recorded.  Subjective:   She presents today for her annual examination.  She states that she has a history of PCO but she is now having normal regular monthly cycles.  She says that birth control was not an issue because she currently has a female partner. She reports history of abnormal Pap smear followed by colposcopically directed biopsies which were "normal".  She has not had a Pap in many years.    Hx: The following portions of the patient's history were reviewed and updated as appropriate:             She  has a past medical history of Irregular periods and PCOS (polycystic ovarian syndrome). She does not have any pertinent problems on file. She  has no past surgical history on file. Her family history includes Breast cancer in her maternal grandmother; Cancer in her paternal grandmother. She  reports that she has quit smoking. She has never used smokeless tobacco. She reports current alcohol use. She reports that she does not use drugs. She currently has no medications in their medication list. She has No Known Allergies.       Review of Systems:  Review of Systems  Constitutional: Denied constitutional symptoms, night sweats, recent illness, fatigue, fever, insomnia and weight loss.  Eyes: Denied eye symptoms, eye pain, photophobia, vision change and visual disturbance.  Ears/Nose/Throat/Neck: Denied ear, nose, throat or neck symptoms, hearing loss, nasal discharge, sinus congestion and sore throat.  Cardiovascular: Denied cardiovascular symptoms, arrhythmia, chest pain/pressure, edema, exercise intolerance, orthopnea and palpitations.  Respiratory: Denied pulmonary symptoms, asthma, pleuritic pain, productive sputum, cough, dyspnea and wheezing.  Gastrointestinal: Denied, gastro-esophageal reflux, melena, nausea and vomiting.  Genitourinary: Denied genitourinary symptoms including symptomatic vaginal discharge,  pelvic relaxation issues, and urinary complaints.  Musculoskeletal: Denied musculoskeletal symptoms, stiffness, swelling, muscle weakness and myalgia.  Dermatologic: Denied dermatology symptoms, rash and scar.  Neurologic: Denied neurology symptoms, dizziness, headache, neck pain and syncope.  Psychiatric: Denied psychiatric symptoms, anxiety and depression.  Endocrine: Denied endocrine symptoms including hot flashes and night sweats.   Meds:   No current outpatient medications on file prior to visit.   No current facility-administered medications on file prior to visit.       The pregnancy intention screening data noted above was reviewed. Potential methods of contraception were discussed. The patient elected to proceed with No Method - Other Reason.     Objective:     Vitals:   02/25/21 0856  BP: 114/73  Pulse: 67    Filed Weights   02/25/21 0856  Weight: 273 lb 1.6 oz (123.9 kg)              Physical examination General NAD, Conversant  HEENT Atraumatic; Op clear with mmm.  Normo-cephalic. Pupils reactive. Anicteric sclerae  Thyroid/Neck Smooth without nodularity or enlargement. Normal ROM.  Neck Supple.  Skin No rashes, lesions or ulceration. Normal palpated skin turgor. No nodularity.  Breasts: No masses or discharge.  Symmetric.  No axillary adenopathy.  Lungs: Clear to auscultation.No rales or wheezes. Normal Respiratory effort, no retractions.  Heart: NSR.  No murmurs or rubs appreciated. No periferal edema  Abdomen: Soft.  Non-tender.  No masses.  No HSM. No hernia  Extremities: Moves all appropriately.  Normal ROM for age. No lymphadenopathy.  Neuro: Oriented to PPT.  Normal mood. Normal affect.     Pelvic:  Vulva: Normal appearance.  No lesions.  Vagina: No lesions or abnormalities noted.  Support: Normal pelvic support.  Urethra No masses tenderness or scarring.  Meatus Normal size without lesions or prolapse.  Cervix: Normal appearance.  No lesions.   Anus: Normal exam.  No lesions.  Perineum: Normal exam.  No lesions.        Bimanual   Uterus: Normal size.  Non-tender.  Mobile.  AV.  Adnexae: No masses.  Non-tender to palpation.  Cul-de-sac: Negative for abnormality.    Exam limited by patient body habitus  Assessment:    G1P0010 Patient Active Problem List   Diagnosis Date Noted  . PCOS (polycystic ovarian syndrome) 12/31/2015  . Obesities, morbid (HCC) 12/31/2015     1. Well woman exam with routine gynecological exam   2. Morbid obesity with BMI of 45.0-49.9, adult (HCC)        Plan:            1.  Basic Screening Recommendations The basic screening recommendations for asymptomatic women were discussed with the patient during her visit.  The age-appropriate recommendations were discussed with her and the rational for the tests reviewed.  When I am informed by the patient that another primary care physician has previously obtained the age-appropriate tests and they are up-to-date, only outstanding tests are ordered and referrals given as necessary.  Abnormal results of tests will be discussed with her when all of her results are completed.  Routine preventative health maintenance measures emphasized: Exercise/Diet/Weight control, Tobacco Warnings, Alcohol/Substance use risks and Stress Management Pap performed-patient will return for fasting blood work.  2.  PCO discussed in detail.  All questions answered.    Orders Orders Placed This Encounter  Procedures  . Hemoglobin A1c  . Lipid panel  . TSH    No orders of the defined types were placed in this encounter.        F/U  No follow-ups on file.  Elonda Husky, M.D. 02/25/2021 9:24 AM

## 2021-02-25 NOTE — Addendum Note (Signed)
Addended by: Dorian Pod on: 02/25/2021 10:53 AM   Modules accepted: Orders

## 2021-02-26 LAB — CYTOLOGY - PAP
Adequacy: ABSENT
Comment: NEGATIVE
Diagnosis: NEGATIVE
High risk HPV: NEGATIVE

## 2021-04-08 ENCOUNTER — Encounter: Payer: BC Managed Care – PPO | Admitting: Obstetrics and Gynecology

## 2021-04-21 ENCOUNTER — Encounter: Payer: Self-pay | Admitting: Obstetrics and Gynecology

## 2021-04-28 ENCOUNTER — Encounter: Payer: BLUE CROSS/BLUE SHIELD | Admitting: Certified Nurse Midwife

## 2021-09-22 DIAGNOSIS — E538 Deficiency of other specified B group vitamins: Secondary | ICD-10-CM | POA: Insufficient documentation

## 2023-02-01 ENCOUNTER — Other Ambulatory Visit: Payer: Self-pay | Admitting: Orthopedic Surgery

## 2023-02-01 ENCOUNTER — Ambulatory Visit
Admission: RE | Admit: 2023-02-01 | Discharge: 2023-02-01 | Disposition: A | Discharge status: Home / Self Care | Payer: BC Managed Care – PPO | Source: Ambulatory Visit | Attending: Orthopedic Surgery | Admitting: Orthopedic Surgery

## 2023-02-01 DIAGNOSIS — S83231A Complex tear of medial meniscus, current injury, right knee, initial encounter: Secondary | ICD-10-CM | POA: Diagnosis not present

## 2023-09-26 DIAGNOSIS — G43009 Migraine without aura, not intractable, without status migrainosus: Secondary | ICD-10-CM | POA: Insufficient documentation

## 2024-07-08 ENCOUNTER — Ambulatory Visit: Admitting: Family Medicine

## 2024-07-08 ENCOUNTER — Encounter: Payer: Self-pay | Admitting: Family Medicine

## 2024-07-08 VITALS — BP 126/83 | HR 74 | Temp 98.5°F | Resp 18 | Ht 64.0 in | Wt 284.0 lb

## 2024-07-08 DIAGNOSIS — R5383 Other fatigue: Secondary | ICD-10-CM | POA: Diagnosis not present

## 2024-07-08 DIAGNOSIS — R7303 Prediabetes: Secondary | ICD-10-CM

## 2024-07-08 DIAGNOSIS — R0683 Snoring: Secondary | ICD-10-CM | POA: Insufficient documentation

## 2024-07-08 NOTE — Progress Notes (Unsigned)
 New Patient Office Visit  Subjective    Patient ID: Shari Guzman, female    DOB: [birth year]  Age: 38 y.o. MRN: [id number]  CC:  Chief Complaint  Patient presents with  . Establish Care  . Fatigue  . Generalized Body Aches    HPI Shari Guzman presents to establish care.  Discussed the use of AI scribe software for clinical note transcription with the patient, who gave verbal consent to proceed.  History of Present Illness   Shari Guzman is a 38 year old female with polycystic ovary disease who presents with occipital neuralgia and fatigue.  She has a history of polycystic ovary disease diagnosed at age 12. Her menstrual cycles are currently regular without the use of birth control, with a flow lasting about four to five days. Her last Pap test was three years ago, and she had an abnormal Pap in 2018, which was followed by a scraping that returned normal results.  She states that she has not had a cervical biopsy.  She experiences occipital neuralgia characterized by pain starting in the back of her neck, radiating to her ipsilateral eye, and causing severe headaches. Gabapentin 300 mg is taken as needed, up to three times a day, to manage the pain. She also uses butalbital for headaches, often in conjunction with gabapentin. The severe episodes have occurred three times over the past couple of years, often triggered by physical activities such as heavy lifting, particularly when assisting her disabled brother.  Another time her occipital neuralgia occurred after power washing.    She describes 'body crashes' occurring mid-afternoon to evening, with symptoms resembling flu-like illness, including joint and muscle aches, extreme fatigue, and facial flushing. 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 She attempts to manage these episodes by resting and hydrating, though  hydration does not seem to affect the frequency or severity.  She reports sinus drainage with black specks occurring occasionally. She primarily drinks water and has reduced soda intake significantly since May.  No dizziness, palpitations, or bladder infections. She experiences foot aches due to high arches. She does not feel refreshed upon waking and has a history of snoring with witnessed apnea by her wife. She denies diarrhea and constipation, heat intolerance.    Mother has an autoimmune disease and multiple medical problem.   Outpatient Encounter Medications as of 07/08/2024  Medication Sig  . butalbital-acetaminophen -caffeine (FIORICET) 50-325-40 MG tablet Take 1 tablet by mouth every 6 (six) hours as needed for migraine.  . gabapentin (NEURONTIN) 300 MG capsule Take 300 mg by mouth 2 (two) times daily.   No facility-administered encounter medications on file as of 07/08/2024.    Past Medical History:  Diagnosis Date  . Irregular periods   . PCOS (polycystic ovarian syndrome)     History reviewed. No pertinent surgical history.  Family History  Problem Relation Age of Onset  . Breast cancer Maternal Grandmother   . Cancer Paternal Grandmother        ovarian    Social History   Socioeconomic History  . Marital status: Single    Spouse name: Not on file  . Number of children: Not on file  . Years of education: Not on file  . Highest education level: Not on file  Occupational History  . Not on file  Tobacco Use  . Smoking status: Former    Passive  exposure: Past  . Smokeless tobacco: Never  Substance and Sexual Activity  . Alcohol use: Yes    Comment: occas  . Drug use: No  . Sexual activity: Yes    Partners: Female    Birth control/protection: None  Other Topics Concern  . Not on file  Social History Narrative  . Not on file   Social Drivers of Health   Financial Resource Strain: Not on file  Food Insecurity: Not on file  Transportation Needs: Not on file   Physical Activity: Not on file  Stress: Not on file  Social Connections: Not on file  Intimate Partner Violence: Not on file    ROS      Objective   BP 126/83 (BP Location: Left Arm, Patient Position: Sitting, Cuff Size: Large)   Pulse 74   Temp 98.5 F (36.9 C) (Oral)   Resp 18   Ht 5' 4 (1.626 m)   Wt 284 lb (128.8 kg)   LMP 06/14/2024 (Approximate)   SpO2 97%   BMI 48.75 kg/m  {Vitals History (Optional):23777}  Physical Exam Vitals and nursing note reviewed.  Constitutional:      Appearance: Normal appearance.  HENT:     Head: Normocephalic and atraumatic.  Eyes:     Conjunctiva/sclera: Conjunctivae normal.  Cardiovascular:     Rate and Rhythm: Normal rate and regular rhythm.  Pulmonary:     Effort: Pulmonary effort is normal.     Breath sounds: Normal breath sounds.  Musculoskeletal:     Right lower leg: No edema.     Left lower leg: No edema.  Skin:    General: Skin is warm and dry.  Neurological:     Mental Status: She is alert and oriented to person, place, and time.  Psychiatric:        Mood and Affect: Mood normal.        Behavior: Behavior normal.        Thought Content: Thought content normal.        Judgment: Judgment normal.      {Perform Simple Foot Exam  Perform Detailed exam:1} {Insert foot Exam (Optional):30965}   {Labs (Optional):23779}  The ASCVD Risk score (Arnett DK, et al., 2019) failed to calculate for the following reasons:   The 2019 ASCVD risk score is only valid for ages 57 to 62     Assessment & Plan:  There are no diagnoses linked to this encounter.  No follow-ups on file.   Docia Klar K Benyamin Jeff, MD

## 2024-07-09 ENCOUNTER — Ambulatory Visit

## 2024-07-09 DIAGNOSIS — R5383 Other fatigue: Secondary | ICD-10-CM

## 2024-07-10 ENCOUNTER — Ambulatory Visit: Payer: Self-pay | Admitting: Family Medicine

## 2024-07-10 LAB — CBC WITH DIFFERENTIAL/PLATELET
Basophils Absolute: 0 x10E3/uL (ref 0.0–0.2)
Basos: 0 %
EOS (ABSOLUTE): 0.2 x10E3/uL (ref 0.0–0.4)
Eos: 2 %
Hematocrit: 40.7 % (ref 34.0–46.6)
Hemoglobin: 13.4 g/dL (ref 11.1–15.9)
Immature Grans (Abs): 0 x10E3/uL (ref 0.0–0.1)
Immature Granulocytes: 0 %
Lymphocytes Absolute: 3 x10E3/uL (ref 0.7–3.1)
Lymphs: 31 %
MCH: 31.6 pg (ref 26.6–33.0)
MCHC: 32.9 g/dL (ref 31.5–35.7)
MCV: 96 fL (ref 79–97)
Monocytes Absolute: 0.8 x10E3/uL (ref 0.1–0.9)
Monocytes: 8 %
Neutrophils Absolute: 5.6 x10E3/uL (ref 1.4–7.0)
Neutrophils: 59 %
Platelets: 327 x10E3/uL (ref 150–450)
RBC: 4.24 x10E6/uL (ref 3.77–5.28)
RDW: 12.9 % (ref 11.7–15.4)
WBC: 9.7 x10E3/uL (ref 3.4–10.8)

## 2024-07-10 LAB — CMP14+EGFR
ALT: 24 IU/L (ref 0–32)
AST: 22 IU/L (ref 0–40)
Albumin: 4.2 g/dL (ref 3.9–4.9)
Alkaline Phosphatase: 65 IU/L (ref 44–121)
BUN/Creatinine Ratio: 19 (ref 9–23)
BUN: 14 mg/dL (ref 6–20)
Bilirubin Total: <0.2 mg/dL (ref 0.0–1.2)
CO2: 24 mmol/L (ref 20–29)
Calcium: 9.7 mg/dL (ref 8.7–10.2)
Chloride: 101 mmol/L (ref 96–106)
Creatinine, Ser: 0.73 mg/dL (ref 0.57–1.00)
Globulin, Total: 2.8 g/dL (ref 1.5–4.5)
Glucose: 80 mg/dL (ref 70–99)
Potassium: 4.6 mmol/L (ref 3.5–5.2)
Sodium: 138 mmol/L (ref 134–144)
Total Protein: 7 g/dL (ref 6.0–8.5)
eGFR: 108 mL/min/1.73 (ref >59)

## 2024-07-10 LAB — VITAMIN B12: Vitamin B-12: 391 pg/mL (ref 232–1245)

## 2024-07-10 LAB — TSH+FREE T4
Free T4: 1.01 ng/dL (ref 0.82–1.77)
TSH: 1.64 u[IU]/mL (ref 0.450–4.500)

## 2024-07-10 LAB — HEMOGLOBIN A1C
Est. average glucose Bld gHb Est-mCnc: 123 mg/dL
Hgb A1c MFr Bld: 5.9 % — ABNORMAL HIGH (ref 4.8–5.6)

## 2024-07-10 LAB — SEDIMENTATION RATE: Sed Rate: 26 mm/h (ref 0–32)

## 2024-07-11 NOTE — Assessment & Plan Note (Signed)
 Checked her EKG and she has NSR without ST or T wave changes associated with ischemia.  Strongly suspect sleep apnea but checking labs that could cause extreme fatigue

## 2024-07-17 ENCOUNTER — Encounter: Payer: Self-pay | Admitting: Sleep Medicine

## 2024-07-17 ENCOUNTER — Ambulatory Visit: Admitting: Sleep Medicine

## 2024-07-17 VITALS — BP 140/110 | HR 93 | Temp 97.7°F | Ht 64.0 in | Wt 284.6 lb

## 2024-07-17 DIAGNOSIS — G4733 Obstructive sleep apnea (adult) (pediatric): Secondary | ICD-10-CM

## 2024-07-17 DIAGNOSIS — J4520 Mild intermittent asthma, uncomplicated: Secondary | ICD-10-CM | POA: Diagnosis not present

## 2024-07-17 DIAGNOSIS — R030 Elevated blood-pressure reading, without diagnosis of hypertension: Secondary | ICD-10-CM | POA: Diagnosis not present

## 2024-07-17 NOTE — Patient Instructions (Signed)
 SABRA

## 2024-07-17 NOTE — Progress Notes (Signed)
 Name:Shari Guzman MRN: [id number] DOB: 12-06-1985   CHIEF COMPLAINT:  EXCESSIVE DAYTIME SLEEPINESS   HISTORY OF PRESENT ILLNESS: Shari Guzman is a 38 y.o. w/ a h/o occipital neuralgia and morbid obesity who present for c/o loud snoring, witnessed apnea and excessive daytime sleepiness which has been present for several years. Reports nocturnal awakenings due to sleep walking, however does not have difficulty falling back to sleep. Denies any significant weight changes. Denies morning headaches, RLS symptoms, dream enactment, cataplexy, hypnagogic or hypnapompic hallucinations. Reports a family history of sleep apnea. Denies drowsy driving. Drinks 1 cup of coffee daily, occasional alcohol use, former smoker, denies illicit drug use.   Bedtime 10-11 pm Sleep onset 20 mins Rise time 6 am   EPWORTH SLEEP SCORE 17    07/17/2024    2:16 PM  Results of the Epworth flowsheet  Sitting and reading 3  Watching TV 2  Sitting, inactive in a public place (e.g. a theatre or a meeting) 2  As a passenger in a car for an hour without a break 3  Lying down to rest in the afternoon when circumstances permit 3  Sitting and talking to someone 0  Sitting quietly after a lunch without alcohol 3  In a car, while stopped for a few minutes in traffic 1  Total score 17     PAST MEDICAL HISTORY :   has a past medical history of Irregular periods and PCOS (polycystic ovarian syndrome).  has no past surgical history on file. Prior to Admission medications   Medication Sig Start Date End Date Taking? Authorizing Provider  butalbital-acetaminophen -caffeine (FIORICET) 50-325-40 MG tablet Take 1 tablet by mouth every 6 (six) hours as needed for migraine. 03/22/24  Yes [provider]  gabapentin (NEURONTIN) 300 MG capsule Take 300 mg by mouth 2 (two) times daily. Patient taking differently: Take 300 mg by mouth 2 (two) times daily. PRN for headache 04/08/24 04/08/25 Yes [provider]   No  Known Allergies  FAMILY HISTORY:  family history includes Breast cancer in her maternal grandmother; Cancer in her paternal grandmother. SOCIAL HISTORY:  reports that she has quit smoking. She has been exposed to tobacco smoke. She has never used smokeless tobacco. She reports current alcohol use. She reports that she does not use drugs.   Review of Systems:  Gen:  Denies  fever, sweats, chills weight loss  HEENT: Denies blurred vision, double vision, ear pain, eye pain, hearing loss, nose bleeds, sore throat Cardiac:  No dizziness, chest pain or heaviness, chest tightness,edema, No JVD Resp:   No cough, -sputum production, -shortness of breath,-wheezing, -hemoptysis,  Gi: Denies swallowing difficulty, stomach pain, nausea or vomiting, diarrhea, constipation, bowel incontinence Gu:  Denies bladder incontinence, burning urine Ext:   Denies Joint pain, stiffness or swelling Skin: Denies  skin rash, easy bruising or bleeding or hives Endoc:  Denies polyuria, polydipsia , polyphagia or weight change Psych:   Denies depression, insomnia or hallucinations  Other:  All other systems negative  VITAL SIGNS: BP (!) 140/110   Pulse 93   Temp 97.7 F (36.5 C) (Temporal)   Ht 5' 4 (1.626 m)   Wt 284 lb 9.6 oz (129.1 kg)   LMP 06/14/2024 (Approximate)   SpO2 97%   BMI 48.85 kg/m    Physical Examination:   General Appearance: No distress  EYES PERRLA, EOM intact.   NECK Supple, No JVD Pulmonary: normal breath sounds, No wheezing.  CardiovascularNormal S1,S2.  No m/r/g.   Abdomen: Benign, Soft, non-tender. Skin:   warm, no rashes, no ecchymosis  Extremities: normal, no cyanosis, clubbing. Neuro:without focal findings,  speech normal  PSYCHIATRIC: Mood, affect within normal limits.   ASSESSMENT AND PLAN  OSA I suspect that OSA is likely present due to clinical presentation. Discussed the consequences of untreated sleep apnea. Advised not to drive drowsy for safety of patient and  others. Will complete further evaluation with a home sleep study and follow up to review results.    BP elevated without diagnosis of HTN BP elevated, advised patient to follow up with PCP for further evaluation.   Morbid obesity Counseled patient on diet and lifestyle modification.  Intermittent wheezing Referring patient for PFT and pulmonary evaluation.    MEDICATION ADJUSTMENTS/LABS AND TESTS ORDERED: Recommend Sleep Study   Patient  satisfied with Plan of action and management. All questions answered  Follow up to review HST results and treatment plan.   I spent a total of 61 minutes reviewing chart data, face-to-face evaluation with the patient, counseling and coordination of care as detailed above.    Shari Guzman, M.D.  Sleep Medicine [facility]

## 2024-07-27 ENCOUNTER — Encounter

## 2024-07-27 DIAGNOSIS — G4733 Obstructive sleep apnea (adult) (pediatric): Secondary | ICD-10-CM

## 2024-08-09 DIAGNOSIS — G4733 Obstructive sleep apnea (adult) (pediatric): Secondary | ICD-10-CM | POA: Diagnosis not present

## 2024-08-15 ENCOUNTER — Ambulatory Visit: Payer: Self-pay

## 2024-08-15 DIAGNOSIS — G4733 Obstructive sleep apnea (adult) (pediatric): Secondary | ICD-10-CM

## 2024-08-29 ENCOUNTER — Encounter: Payer: Self-pay | Admitting: Student in an Organized Health Care Education/Training Program

## 2024-08-29 ENCOUNTER — Ambulatory Visit: Admitting: Student in an Organized Health Care Education/Training Program

## 2024-08-29 VITALS — BP 110/70 | HR 76 | Temp 98.7°F | Ht 64.0 in | Wt 287.8 lb

## 2024-08-29 DIAGNOSIS — R0602 Shortness of breath: Secondary | ICD-10-CM

## 2024-08-29 DIAGNOSIS — Z6841 Body Mass Index (BMI) 40.0 and over, adult: Secondary | ICD-10-CM

## 2024-08-29 LAB — NITRIC OXIDE: Nitric Oxide: 22

## 2024-08-29 NOTE — Progress Notes (Signed)
 Assessment & Plan:   #SOB (shortness of breath) (Primary)  She experiences intermittent wheezing and shortness of breath throughout the day, unrelieved by albuterol, with no cough or phlegm. Symptoms began after a COVID-19 infection in 2020 and have persisted. No family history of asthma. Possible triggers include mold exposure and weight. Differential diagnosis includes asthma and tracheobronchomalacia.   Will order a pulmonary function test to assess for asthma and a dynamic chest CT to evaluate for tracheobronchomalacia. Will start ICS/LABA if her PFT's show an obstructive process.  - Nitric oxide indeterminate today - Pulmonary Function Test; Future - CT CHEST HIGH RESOLUTION; Future  #Obesity    She has obesity with recent weight gain. Previously used Ozempic for weight loss, but insurance coverage is an issue. Weight may exacerbate asthma severity and shortness of breath. Discuss weight loss medication options with her primary care and sleep medicine provider, including the possibility of using terzepatide, which may aid in weight loss and improve sleep apnea.       Return in about 6 weeks (around 10/10/2024).  [name], MD [facility]  I spent 45 minutes caring for this patient today, including preparing to see the patient, obtaining a medical history , reviewing a separately obtained history, performing a medically appropriate examination and/or evaluation, counseling and educating the patient/family/caregiver, ordering medications, tests, or procedures, documenting clinical information in the electronic health record, and independently interpreting results (not separately reported/billed) and communicating results to the patient/family/caregiver  End of visit medications:  No orders of the defined types were placed in this encounter.    Current Outpatient Medications:    butalbital-acetaminophen -caffeine (FIORICET) 50-325-40 MG tablet, Take 1  tablet by mouth every 6 (six) hours as needed for migraine., Disp: , Rfl:    gabapentin (NEURONTIN) 300 MG capsule, Take 300 mg by mouth 2 (two) times daily. (Patient taking differently: Take 300 mg by mouth 2 (two) times daily. PRN for headache), Disp: , Rfl:    Subjective:   PATIENT ID: Shari Guzman GENDER: female DOB: 10-04-1986, MRN: [id number]  Chief Complaint  Patient presents with   Asthma    Diagnosed with Asthma after having Covid in 2020. SOB. Wheezing. No cough.     HPI  Discussed the use of AI scribe software for clinical note transcription with the patient, who gave verbal consent to proceed.  Shari Guzman is a 38 year old female with obesity and sleep apnea who presents with shortness of breath and wheezing.  She experiences frequent wheezing, described as a high-pitched, squeaky sound during exhalation, occurring randomly throughout the day, including in the morning, around six o'clock in the evening, and during work hours. The wheezing impacts her during meetings and while watching TV. Albuterol inhalers have not alleviated her symptoms. The wheezing does not improve with coughing and is not associated with phlegm production from the lungs, although she experiences constant sinus drainage.  Shortness of breath occurs when she is already wheezing and can worsen with activities such as changing clothes during these episodes. It is described as labored breathing during these episodes. The wheezing and shortness of breath occur in cycles and are not triggered by specific activities, as they can start randomly while she is sitting and working.  She has a history of sinus drainage and has been taking Claritin for a couple of months without noticing any improvement. Flonase nasal spray did not stop the drainage. Occasionally, she notices black specks in the sinus drainage.  Her past medical history includes obesity and a recent diagnosis of sleep apnea. She has a history of  smoking, having quit in 2013 after smoking occasionally since age 57. She denies current smoking or vaping. She has a history of social smoking and hookah use. She reports a significant weight gain after tearing her ACL last year, which limited her ability to exercise. She currently has an air cast due to bone spurs affecting her Achilles tendon.  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 Her colds tend to last longer than those of her coworkers, sometimes up to a couple of weeks. She has three golden retrievers at home but does not notice a worsening of symptoms around them.  She has no personal or family history of asthma. Her wheezing began after contracting COVID-19 in 2020 and has persisted since then. She has no known allergies and denies any manufacturing or factory work exposure. She mentions a concern about potential mold exposure at her workplace, which was previously shut down for mold issues.      Ancillary information including prior medications, full medical/surgical/family/social histories, and PFTs (when available) are listed below and have been reviewed.   Review of Systems  Constitutional:  Negative for chills, fever, malaise/fatigue and weight loss.  Respiratory:  Positive for cough, shortness of breath and wheezing. Negative for hemoptysis and sputum production.   Cardiovascular:  Negative for chest pain.     Objective:   Vitals:   08/29/24 1528  BP: 110/70  Pulse: 76  Temp: 98.7 F (37.1 C)  SpO2: 98%  Weight: 287 lb 12.8 oz (130.5 kg)  Height: 5' 4 (1.626 m)   98% on RA BMI Readings from Last 3 Encounters:  08/29/24 49.40 kg/m  07/17/24 48.85 kg/m  07/08/24 48.75 kg/m   Wt Readings from Last 3 Encounters:  08/29/24 287 lb 12.8 oz (130.5 kg)  07/17/24 284 lb 9.6 oz (129.1 kg)  07/08/24 284 lb (128.8 kg)    Physical Exam Constitutional:      Appearance: Normal  appearance. She is obese.  Cardiovascular:     Rate and Rhythm: Normal rate and regular rhythm.     Pulses: Normal pulses.     Heart sounds: Normal heart sounds.  Pulmonary:     Effort: Pulmonary effort is normal.     Breath sounds: Wheezing (faint end expiratory wheezing) present.  Neurological:     General: No focal deficit present.     Mental Status: She is alert and oriented to person, place, and time. Mental status is at baseline.       Ancillary Information    Past Medical History:  Diagnosis Date   Irregular periods    PCOS (polycystic ovarian syndrome)      Family History  Problem Relation Age of Onset   Breast cancer Maternal Grandmother    Cancer Paternal Grandmother        ovarian     History reviewed. No pertinent surgical history.  Social History   Socioeconomic History   Marital status: Single    Spouse name: Not on file   Number of children: Not on file   Years of education: Not on file   Highest education level: Not on file  Occupational History   Not on file  Tobacco Use   Smoking status: Former    Current packs/day: 0.00    Types: Cigarettes    Quit date: 2013  Years since quitting: 12.8    Passive exposure: Past   Smokeless tobacco: Never   Tobacco comments:    Quit smoking in 2013    Started smoking at 38 years old    Smoked 1 pack per week at her heaviest.  Substance and Sexual Activity   Alcohol use: Yes    Comment: occas   Drug use: No   Sexual activity: Yes    Partners: Female    Birth control/protection: None  Other Topics Concern   Not on file  Social History Narrative   Not on file   Social Drivers of Health   Financial Resource Strain: Not on file  Food Insecurity: Not on file  Transportation Needs: Not on file  Physical Activity: Not on file  Stress: Not on file  Social Connections: Not on file  Intimate Partner Violence: Not on file     No Known Allergies   CBC    Component Value Date/Time   WBC 9.7  07/09/2024 1557   RBC 4.24 07/09/2024 1557   HGB 13.4 07/09/2024 1557   HCT 40.7 07/09/2024 1557   PLT 327 07/09/2024 1557   MCV 96 07/09/2024 1557   MCH 31.6 07/09/2024 1557   MCHC 32.9 07/09/2024 1557   RDW 12.9 07/09/2024 1557   LYMPHSABS 3.0 07/09/2024 1557   EOSABS 0.2 07/09/2024 1557   BASOSABS 0.0 07/09/2024 1557    Pulmonary Functions Testing Results:     No data to display          Outpatient Medications Prior to Visit  Medication Sig Dispense Refill   butalbital-acetaminophen -caffeine (FIORICET) 50-325-40 MG tablet Take 1 tablet by mouth every 6 (six) hours as needed for migraine.     gabapentin (NEURONTIN) 300 MG capsule Take 300 mg by mouth 2 (two) times daily. (Patient taking differently: Take 300 mg by mouth 2 (two) times daily. PRN for headache)     No facility-administered medications prior to visit.

## 2024-08-29 NOTE — Patient Instructions (Signed)
  VISIT SUMMARY: Today, you were seen for shortness of breath and wheezing. We discussed your symptoms, which include frequent wheezing and shortness of breath that began after a COVID-19 infection in 2020. We also reviewed your recent diagnosis of obstructive sleep apnea and your history of obesity. We discussed potential triggers for your symptoms and possible treatment options.  YOUR PLAN: -SHORTNESS OF BREATH AND WHEEZING: Your symptoms of wheezing and shortness of breath may be due to asthma or tracheomalacia. Asthma is a condition where your airways become inflamed and narrow, making it hard to breathe. Tracheomalacia is a condition where the windpipe is weak and can collapse. We will conduct a pulmonary function test to check for asthma and a chest CT scan to evaluate for tracheomalacia. If asthma is confirmed, we may start you on an inhaler and consider allergy testing.  -OBSTRUCTIVE SLEEP APNEA: Obstructive sleep apnea is a condition where your airway becomes blocked during sleep, causing breathing pauses. Weight loss can help improve this condition. Please discuss weight loss strategies and medications with your primary care provider to help manage your sleep apnea.  -OBESITY: Obesity is a condition where you have an excessive amount of body fat, which can worsen your asthma and shortness of breath. We discussed weight loss medication options, including Rezipitide, which may help you lose weight and improve your sleep apnea. Please talk to your primary care provider about these options.  INSTRUCTIONS: Please follow up with your primary care provider to discuss weight loss strategies and medications. We will schedule a pulmonary function test and a chest CT scan to further evaluate your symptoms. If you have any new or worsening symptoms, please contact our office.

## 2024-09-05 ENCOUNTER — Encounter: Payer: Self-pay | Admitting: Sleep Medicine

## 2024-09-05 ENCOUNTER — Ambulatory Visit
Admission: RE | Admit: 2024-09-05 | Discharge: 2024-09-05 | Disposition: A | Discharge status: Home / Self Care | Source: Ambulatory Visit | Attending: Family Medicine | Admitting: Family Medicine

## 2024-09-05 DIAGNOSIS — G4733 Obstructive sleep apnea (adult) (pediatric): Secondary | ICD-10-CM

## 2024-09-05 DIAGNOSIS — R0602 Shortness of breath: Secondary | ICD-10-CM | POA: Diagnosis present

## 2024-09-09 ENCOUNTER — Other Ambulatory Visit (HOSPITAL_COMMUNITY): Payer: Self-pay

## 2024-09-10 ENCOUNTER — Ambulatory Visit: Payer: Self-pay | Admitting: Student in an Organized Health Care Education/Training Program

## 2024-09-30 MED ORDER — ZEPBOUND 2.5 MG/0.5ML ~~LOC~~ SOLN: 2.5000 mg | SUBCUTANEOUS | 0 refills | Status: DC

## 2024-10-02 MED ORDER — ZEPBOUND 2.5 MG/0.5ML ~~LOC~~ SOLN: 2.5000 mg | SUBCUTANEOUS | 0 refills | Status: DC

## 2024-10-02 NOTE — Addendum Note (Signed)
 Addended by: Daytona Hedman J on: 10/02/2024 11:17 AM   Modules accepted: Orders

## 2024-10-16 ENCOUNTER — Ambulatory Visit: Admitting: Sleep Medicine

## 2024-10-16 ENCOUNTER — Ambulatory Visit

## 2024-10-16 ENCOUNTER — Encounter: Payer: Self-pay | Admitting: Student in an Organized Health Care Education/Training Program

## 2024-10-16 ENCOUNTER — Ambulatory Visit: Admitting: Student in an Organized Health Care Education/Training Program

## 2024-10-16 VITALS — BP 114/76 | HR 85 | Temp 97.6°F | Ht 64.0 in | Wt 287.0 lb

## 2024-10-16 DIAGNOSIS — Z87891 Personal history of nicotine dependence: Secondary | ICD-10-CM

## 2024-10-16 DIAGNOSIS — R0602 Shortness of breath: Secondary | ICD-10-CM | POA: Diagnosis not present

## 2024-10-16 DIAGNOSIS — Z6841 Body Mass Index (BMI) 40.0 and over, adult: Secondary | ICD-10-CM

## 2024-10-16 DIAGNOSIS — E669 Obesity, unspecified: Secondary | ICD-10-CM

## 2024-10-16 DIAGNOSIS — J4520 Mild intermittent asthma, uncomplicated: Secondary | ICD-10-CM | POA: Diagnosis not present

## 2024-10-16 LAB — PULMONARY FUNCTION TEST
DL/VA % pred: 136 %
DL/VA: 6.06 ml/min/mmHg/L
DLCO unc % pred: 116 %
DLCO unc: 25.73 ml/min/mmHg
FEF 25-75 Post: 3.77 L/s
FEF 25-75 Pre: 2.91 L/s
FEF2575-%Change-Post: 29 %
FEF2575-%Pred-Post: 117 %
FEF2575-%Pred-Pre: 90 %
FEV1-%Change-Post: 5 %
FEV1-%Pred-Post: 93 %
FEV1-%Pred-Pre: 88 %
FEV1-Post: 2.84 L
FEV1-Pre: 2.69 L
FEV1FVC-%Change-Post: 1 %
FEV1FVC-%Pred-Pre: 101 %
FEV6-%Change-Post: 3 %
FEV6-%Pred-Post: 90 %
FEV6-%Pred-Pre: 87 %
FEV6-Post: 3.32 L
FEV6-Pre: 3.21 L
FEV6FVC-%Pred-Post: 101 %
FEV6FVC-%Pred-Pre: 101 %
FVC-%Change-Post: 3 %
FVC-%Pred-Post: 89 %
FVC-%Pred-Pre: 86 %
FVC-Post: 3.32 L
FVC-Pre: 3.21 L
Post FEV1/FVC ratio: 86 %
Post FEV6/FVC ratio: 100 %
Pre FEV1/FVC ratio: 84 %
Pre FEV6/FVC Ratio: 100 %
RV % pred: 82 %
RV: 1.28 L
TLC % pred: 91 %
TLC: 4.62 L

## 2024-10-16 MED ORDER — AIRSUPRA 90-80 MCG/ACT IN AERO: 2.0000 | INHALATION_SPRAY | Freq: Four times a day (QID) | RESPIRATORY_TRACT | 6 refills | Status: AC | PRN

## 2024-10-16 NOTE — Progress Notes (Unsigned)
 Assessment & Plan:   Assessment & Plan  #Mild intermittent asthma  Intermittent wheezing with minimal obstruction on PFTs. CT scan unremarkable. PFTs show 88% of predicted FEV1, improving to 93% post-albuterol, indicating subtle asthma response. Symptoms decreased in frequency. - Prescribed Airsupra  inhaler as needed, two puffs every six hours. - Instructed to contact office if insurance does not cover Airsupra . - Advised to report frequent inhaler use. - Albuterol-Budesonide (AIRSUPRA ) 90-80 MCG/ACT AERO; Inhale 2 puffs into the lungs every 6 (six) hours as needed.  Dispense: 10.7 g; Refill: 6 - Scheduled follow-up in six months.    Return in about 6 months (around 04/16/2025).  [name], MD [facility] 10/16/2024 4:28 PM   I spent *** minutes caring for this patient today, including {EM billing:28027}  End of visit medications:  Meds ordered this encounter  Medications   Albuterol-Budesonide (AIRSUPRA ) 90-80 MCG/ACT AERO    Sig: Inhale 2 puffs into the lungs every 6 (six) hours as needed.    Dispense:  10.7 g    Refill:  6    Current Medications[1]   Subjective:   PATIENT ID: Shari Guzman GENDER: female DOB: 10-Jan-1986, MRN: [id number]  Chief Complaint  Patient presents with   Shortness of Breath    HPI  Discussed the use of AI scribe software for clinical note transcription with the patient, who gave verbal consent to proceed.  History of Present Illness Shari Guzman is a 38 year old female who presents with shortness of breath and cough.  Initial Visit 08/29/2024:  She experiences frequent wheezing, described as a high-pitched, squeaky sound during exhalation, occurring randomly throughout the day, including in the morning, around six o'clock in the evening, and during work hours. The wheezing impacts her during meetings and while watching TV. Albuterol inhalers have not alleviated her symptoms. The wheezing does not improve with  coughing and is not associated with phlegm production from the lungs, although she experiences constant sinus drainage.   Shortness of breath occurs when she is already wheezing and can worsen with activities such as changing clothes during these episodes. It is described as labored breathing during these episodes. The wheezing and shortness of breath occur in cycles and are not triggered by specific activities, as they can start randomly while she is sitting and working.   She has a history of sinus drainage and has been taking Claritin for a couple of months without noticing any improvement. Flonase nasal spray did not stop the drainage. Occasionally, she notices black specks in the sinus drainage.  Return Visit 10/16/2024:  She has been experiencing shortness of breath and cough, with pulmonary function tests indicating minimal obstruction at low lung volumes. A CT scan of the chest was unremarkable, showing no signs of fibrosis or tracheobronchomalacia.  She describes her wheezing as random and not triggered by cold weather. She captured a video of the wheezing last week. Albuterol was prescribed, which made her feel shaky but did not provide a clear improvement in symptoms. The wheezing has decreased over the past several weeks.  She recently started on tirzepatide  two weeks ago for obesity management. She reports no side effects and notes a decrease in appetite, stating she is only interested in eating half of her usual small salad at work. She mentions a weight difference of four pounds, though she attributes this to weighing at different times of the day.   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 No new respiratory symptoms reported.  Her past medical history includes obesity and a recent diagnosis of sleep apnea. She has a history of smoking, having quit in 2013 after smoking occasionally since age 56. She denies current  smoking or vaping. She has a history of social smoking and hookah use. She reports a significant weight gain after tearing her ACL last year, which limited her ability to exercise. She currently has an air cast due to bone spurs affecting her Achilles tendon.   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 Her colds tend to last longer than those of her coworkers, sometimes up to a couple of weeks. She has three golden retrievers at home but does not notice a worsening of symptoms around them.   She has no personal or family history of asthma. Her wheezing began after contracting COVID-19 in 2020 and has persisted since then. She has no known allergies and denies any manufacturing or factory work exposure. She mentions a concern about potential mold exposure at her workplace, which was previously shut down for mold issues.    Ancillary information including prior medications, full medical/surgical/family/social histories, and PFTs (when available) are listed below and have been reviewed.   {PULM QUESTIONNAIRES (Optional):33196}  ROS   Objective:   Vitals:   10/16/24 1552  BP: 114/76  Pulse: 85  Temp: 97.6 F (36.4 C)  TempSrc: Temporal  SpO2: 99%  Weight: 287 lb (130.2 kg)  Height: 5' 4 (1.626 m)   99% on *** LPM *** RA BMI Readings from Last 3 Encounters:  10/16/24 49.26 kg/m  10/16/24 49.26 kg/m  08/29/24 49.40 kg/m   Wt Readings from Last 3 Encounters:  10/16/24 287 lb (130.2 kg)  10/16/24 287 lb (130.2 kg)  08/29/24 287 lb 12.8 oz (130.5 kg)    .vitalsmbmi  Physical Exam    Ancillary Information    Past Medical History:  Diagnosis Date   Irregular periods    PCOS (polycystic ovarian syndrome)      Family History  Problem Relation Age of Onset   Breast cancer Maternal Grandmother    Cancer Paternal Grandmother        ovarian     No past surgical history on  file.  Social History   Socioeconomic History   Marital status: Single    Spouse name: Not on file   Number of children: Not on file   Years of education: Not on file   Highest education level: Not on file  Occupational History   Not on file  Tobacco Use   Smoking status: Former    Current packs/day: 0.00    Types: Cigarettes    Quit date: 2013    Years since quitting: 12.9    Passive exposure: Past   Smokeless tobacco: Never   Tobacco comments:    Quit smoking in 2013    Started smoking at 38 years old    Smoked 1 pack per week at her heaviest.  Substance and Sexual Activity   Alcohol use: Yes    Comment: occas   Drug use: No   Sexual activity: Yes    Partners: Female    Birth control/protection: None  Other Topics Concern   Not on file  Social History Narrative   Not on file   Social Drivers of Health   Tobacco Use: Medium Risk (10/16/2024)   Patient History  Smoking Tobacco Use: Former    Smokeless Tobacco Use: Never    Passive Exposure: Past  Physicist, Medical Strain: Medium Risk (09/02/2024)   Received from [facility]   Overall Financial Resource Strain (CARDIA)    Difficulty of Paying Living Expenses: Somewhat hard  Food Insecurity: No Food Insecurity (09/02/2024)   Received from [facility]   Epic    Within the past 12 months, you worried that your food would run out before you got the money to buy more.: Never true    Within the past 12 months, the food you bought just didn't last and you didn't have money to get more.: Never true  Transportation Needs: No Transportation Needs (09/02/2024)   Received from [facility] - Transportation    In the past 12 months, has lack of transportation kept you from medical appointments or from getting medications?: No    Lack of Transportation (Non-Medical): No  Physical Activity: Not on file  Stress: Not on file  Social Connections: Not on file   Intimate Partner Violence: Not on file  Depression (EYV7-0): Not on file  Alcohol Screen: Not on file  Housing: High Risk (09/23/2024)   Received from [facility]   Epic    In the last 12 months, was there a time when you were not able to pay the mortgage or rent on time?: Yes    In the past 12 months, how many times have you moved where you were living?: 1    At any time in the past 12 months, were you homeless or living in a shelter (including now)?: No  Utilities: Not At Risk (09/02/2024)   Received from [facility]   Epic    In the past 12 months has the electric, gas, oil, or water company threatened to shut off services in your home?: No  Health Literacy: Not on file     Allergies[2]   CBC    Component Value Date/Time   WBC 9.7 07/09/2024 1557   RBC 4.24 07/09/2024 1557   HGB 13.4 07/09/2024 1557   HCT 40.7 07/09/2024 1557   PLT 327 07/09/2024 1557   MCV 96 07/09/2024 1557   MCH 31.6 07/09/2024 1557   MCHC 32.9 07/09/2024 1557   RDW 12.9 07/09/2024 1557   LYMPHSABS 3.0 07/09/2024 1557   EOSABS 0.2 07/09/2024 1557   BASOSABS 0.0 07/09/2024 1557    Pulmonary Functions Testing Results:    Latest Ref Rng & Units 10/16/2024    2:41 PM  PFT Results  FVC-Pre L 3.21   FVC-Predicted Pre % 86   FVC-Post L 3.32   FVC-Predicted Post % 89   Pre FEV1/FVC % % 84   Post FEV1/FCV % % 86   FEV1-Pre L 2.69   FEV1-Predicted Pre % 88   FEV1-Post L 2.84   DLCO uncorrected ml/min/mmHg 25.73   DLCO UNC% % 116   DLVA Predicted % 136   TLC L 4.62   TLC % Predicted % 91   RV % Predicted % 82     Outpatient Medications Prior to Visit  Medication Sig Dispense Refill   butalbital-acetaminophen -caffeine (FIORICET) 50-325-40 MG tablet Take 1 tablet by mouth every 6 (six) hours as needed for migraine.     gabapentin (NEURONTIN) 300 MG capsule Take 300 mg by mouth 2 (two) times daily. (Patient taking differently: Take 300 mg by mouth 2 (two) times  daily. PRN  for headache)     ZEPBOUND  2.5 MG/0.5ML injection vial Inject 2.5 mg into the skin once a week. 2 mL 0   No facility-administered medications prior to visit.      [1]  Current Outpatient Medications:    Albuterol-Budesonide (AIRSUPRA ) 90-80 MCG/ACT AERO, Inhale 2 puffs into the lungs every 6 (six) hours as needed., Disp: 10.7 g, Rfl: 6   butalbital-acetaminophen -caffeine (FIORICET) 50-325-40 MG tablet, Take 1 tablet by mouth every 6 (six) hours as needed for migraine., Disp: , Rfl:    gabapentin (NEURONTIN) 300 MG capsule, Take 300 mg by mouth 2 (two) times daily. (Patient taking differently: Take 300 mg by mouth 2 (two) times daily. PRN for headache), Disp: , Rfl:    ZEPBOUND  2.5 MG/0.5ML injection vial, Inject 2.5 mg into the skin once a week., Disp: 2 mL, Rfl: 0 [2] No Known Allergies

## 2024-10-16 NOTE — Patient Instructions (Addendum)
°  VISIT SUMMARY: Today, you were seen for shortness of breath and cough. Your pulmonary function tests showed minimal obstruction, and your CT scan was normal. You have been experiencing random wheezing, which has decreased over the past few weeks. You recently started tirzepatide  for weight management and have noticed a decrease in appetite without any side effects. 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  YOUR PLAN: -MILD INTERMITTENT ASTHMA: Mild intermittent asthma means you have occasional asthma symptoms with minimal obstruction in your airways. Your pulmonary function tests showed a slight improvement after using albuterol, indicating a mild asthma response. You have been prescribed an Airsupra  inhaler to use as needed, with two puffs every six hours. Please contact our office if your insurance does not cover Airsupra , and report if you find yourself using the inhaler frequently. We will follow up in six months to monitor your condition.  INSTRUCTIONS: Please follow up in six months for a re-evaluation of your asthma. Contact our office if your insurance does not cover the Airsupra  inhaler or if you need to use it frequently.     Contains text generated by [employer]bridge.

## 2024-10-16 NOTE — Patient Instructions (Signed)
 Full PFT completed today ? ?

## 2024-10-16 NOTE — Progress Notes (Signed)
 Full PFT completed today ? ?

## 2024-10-22 ENCOUNTER — Other Ambulatory Visit: Payer: Self-pay | Admitting: Sleep Medicine

## 2024-10-22 DIAGNOSIS — G4733 Obstructive sleep apnea (adult) (pediatric): Secondary | ICD-10-CM

## 2024-10-28 ENCOUNTER — Encounter: Payer: Self-pay | Admitting: Sleep Medicine

## 2024-10-28 ENCOUNTER — Ambulatory Visit: Admitting: Sleep Medicine

## 2024-10-28 VITALS — BP 110/80 | HR 75 | Temp 98.6°F | Ht 64.0 in | Wt 287.4 lb

## 2024-10-28 DIAGNOSIS — Z713 Dietary counseling and surveillance: Secondary | ICD-10-CM

## 2024-10-28 DIAGNOSIS — G4733 Obstructive sleep apnea (adult) (pediatric): Secondary | ICD-10-CM

## 2024-10-28 DIAGNOSIS — Z6841 Body Mass Index (BMI) 40.0 and over, adult: Secondary | ICD-10-CM

## 2024-10-28 MED ORDER — ZEPBOUND 5 MG/0.5ML ~~LOC~~ SOAJ: 5.0000 mg | SUBCUTANEOUS | 0 refills | Status: DC

## 2024-10-28 NOTE — Telephone Encounter (Signed)
 Closing encounter next dose has been submitted during pt's visit.

## 2024-10-28 NOTE — Progress Notes (Signed)
 "      Name:Shari Guzman MRN: [id number] DOB: Aug 10, 1986   CHIEF COMPLAINT:  CPAP F/U   HISTORY OF PRESENT ILLNESS: Shari Guzman is a 38 y.o. w/ a h/o OSA, occipital neuralgia and morbid obesity who presents for CPAP follow up visit. Reports using CPAP therapy every night, which is confirmed by compliance data. She is currently using the Airfit N20 nasal mask, which is comfortable. Reports some mild daytime sleepiness despite adequate CPAP usage. Reports a few episodes of dream enactment over the last month.   Patient is on Zepbound  2.5 mg weekly, reports a 6 lb weight loss over the last few weeks. Denies any side effects.    EPWORTH SLEEP SCORE     07/17/2024    2:16 PM  Results of the Epworth flowsheet  Sitting and reading 3  Watching TV 2  Sitting, inactive in a public place (e.g. a theatre or a meeting) 2  As a passenger in a car for an hour without a break 3  Lying down to rest in the afternoon when circumstances permit 3  Sitting and talking to someone 0  Sitting quietly after a lunch without alcohol 3  In a car, while stopped for a few minutes in traffic 1  Total score 17    PAST MEDICAL HISTORY :   has a past medical history of Irregular periods and PCOS (polycystic ovarian syndrome).  has no past surgical history on file. Prior to Admission medications   Medication Sig Start Date End Date Taking? Authorizing Provider  butalbital-acetaminophen -caffeine (FIORICET) 50-325-40 MG tablet Take 1 tablet by mouth every 6 (six) hours as needed for migraine. 03/22/24  Yes [provider]  gabapentin (NEURONTIN) 300 MG capsule Take 300 mg by mouth 2 (two) times daily. Patient taking differently: Take 300 mg by mouth 2 (two) times daily. PRN for headache 04/08/24 04/08/25 Yes [provider]   No Known Allergies  FAMILY HISTORY:  family history includes Breast cancer in her maternal grandmother; Cancer in her paternal grandmother. SOCIAL HISTORY:  reports that she  quit smoking about 13 years ago. Her smoking use included cigarettes. She has been exposed to tobacco smoke. She has never used smokeless tobacco. She reports current alcohol use. She reports that she does not use drugs.   Review of Systems:  Gen:  Denies  fever, sweats, chills weight loss  HEENT: Denies blurred vision, double vision, ear pain, eye pain, hearing loss, nose bleeds, sore throat Cardiac:  No dizziness, chest pain or heaviness, chest tightness,edema, No JVD Resp:   No cough, -sputum production, -shortness of breath,-wheezing, -hemoptysis,  Gi: Denies swallowing difficulty, stomach pain, nausea or vomiting, diarrhea, constipation, bowel incontinence Gu:  Denies bladder incontinence, burning urine Ext:   Denies Joint pain, stiffness or swelling Skin: Denies  skin rash, easy bruising or bleeding or hives Endoc:  Denies polyuria, polydipsia , polyphagia or weight change Psych:   Denies depression, insomnia or hallucinations  Other:  All other systems negative  VITAL SIGNS: BP 110/80   Pulse 75   Temp 98.6 F (37 C)   Ht 5' 4 (1.626 m)   Wt 287 lb 6.4 oz (130.4 kg)   SpO2 96%   BMI 49.33 kg/m     Physical Examination:   General Appearance: No distress  EYES PERRLA, EOM intact.   NECK Supple, No JVD Pulmonary: normal breath sounds, No wheezing.  CardiovascularNormal S1,S2.  No m/r/g.   Abdomen: Benign, Soft, non-tender. Skin:   warm,  no rashes, no ecchymosis  Extremities: normal, no cyanosis, clubbing. Neuro:without focal findings,  speech normal  PSYCHIATRIC: Mood, affect within normal limits.   ASSESSMENT AND PLAN  OSA Patient is using and benefiting from CPAP therapy. Discussed the consequences of untreated sleep apnea. Advised not to drive drowsy for safety of patient and others. Will follow up in 3 months.     Morbid obesity Counseled patient on diet and lifestyle modification. Increased Zepbound  dose to 5 mg weekly.    Patient  satisfied with Plan of  action and management. All questions answered  I spent a total of 25 minutes reviewing chart data, face-to-face evaluation with the patient, counseling and coordination of care as detailed above.    Arya Boxley, M.D.  Sleep Medicine [facility]        "

## 2024-10-29 MED ORDER — ZEPBOUND 5 MG/0.5ML ~~LOC~~ SOLN: 5.0000 mg | SUBCUTANEOUS | 0 refills | Status: DC

## 2024-10-29 NOTE — Addendum Note (Signed)
 Addended by: Deaunna Olarte J on: 10/29/2024 12:26 PM   Modules accepted: Orders

## 2024-11-12 ENCOUNTER — Ambulatory Visit: Payer: Self-pay

## 2024-11-12 NOTE — Telephone Encounter (Signed)
 FYI Only or Action Required?: FYI only for provider: appointment scheduled on 11/13/24.  Patient was last seen in primary care on 07/08/2024 by Ziglar, Susan K, MD.  Called Nurse Triage reporting Vomiting and Diarrhea.  Symptoms began several days ago.  Interventions attempted: Rest, hydration, or home remedies.  Symptoms are: gradually worsening.  Triage Disposition: See Physician Within 24 Hours  Patient/caregiver understands and will follow disposition?: Yes   Reason for Disposition  [1] MILD or MODERATE vomiting AND [2] present > 48 hours (2 days)  (Exception: Mild vomiting with associated diarrhea.)  Answer Assessment - Initial Assessment Questions Patient states that she had vomiting and diarrhea starting Early Sunday morning. She thought she was doing better, but then experienced it again yesterday evening. Today she states she has been experiencing it all day with vomiting 4x and diarrhea 2x. She states that today she has not been able to eat or keep any liquids down. She notes that urine is darker, but denies any other signs of dehydration. Office visit scheduled.   1. VOMITING SEVERITY: How many times have you vomited in the past 24 hours?      Moderate-4 times today  2. ONSET: When did the vomiting begin?      Sunday  3. FLUIDS: What fluids or food have you vomited up today? Have you been able to keep any fluids down?     Has not been able to keep fluids down  4. ABDOMEN PAIN: Are your having any abdomen pain? If Yes : How bad is it and what does it feel like? (e.g., crampy, dull, intermittent, constant)      Unknown  5. DIARRHEA: Is there any diarrhea? If Yes, ask: How many times today?      Yes, 2 times today  6. CONTACTS: Is there anyone else in the family with the same symptoms?      No  7. CAUSE: What do you think is causing your vomiting?     Not sure  8. HYDRATION STATUS: Any signs of dehydration? (e.g., dry mouth [not only dry lips],  too weak to stand) When did you last urinate?     States that urine is a little darker, but still not decreased in amount; denies other symptoms of dehydration  9. OTHER SYMPTOMS: Do you have any other symptoms? (e.g., fever, headache, vertigo, vomiting blood or coffee grounds, recent head injury)     Denies any other symptoms  10. PREGNANCY: Is there any chance you are pregnant? When was your last menstrual period?       Unknown  Protocols used: Vomiting-A-AH  Reason for Triage: vomiting since last Sunday, diarrhea, nauseous.

## 2024-11-13 ENCOUNTER — Encounter: Payer: Self-pay | Admitting: Family Medicine

## 2024-11-13 ENCOUNTER — Ambulatory Visit: Admitting: Family Medicine

## 2024-11-13 VITALS — BP 114/80 | HR 78 | Temp 98.4°F | Resp 16 | Ht 64.0 in | Wt 273.2 lb

## 2024-11-13 DIAGNOSIS — R110 Nausea: Secondary | ICD-10-CM | POA: Insufficient documentation

## 2024-11-13 DIAGNOSIS — K529 Noninfective gastroenteritis and colitis, unspecified: Secondary | ICD-10-CM | POA: Insufficient documentation

## 2024-11-13 MED ORDER — ONDANSETRON 8 MG PO TBDP: 8.0000 mg | ORAL_TABLET | Freq: Three times a day (TID) | ORAL | 0 refills | Status: AC | PRN

## 2024-11-13 NOTE — Assessment & Plan Note (Signed)
 Ondansetron  8 mg every 8 hours for nausea.  When she can keep fluids down please alternate liquid IV with plain water for hydration.

## 2024-11-13 NOTE — Assessment & Plan Note (Signed)
 Acute gastroenteritis with dehydration.  Symptoms started after a flulike illness.  She has vomiting and diarrhea and likely is dehydrated.  Ask her to follow the BRAT diet (bananas, rice, applesauce, toast to help with her diarrhea.  Advised rest and avoidance of work until dizziness resolves.

## 2024-11-13 NOTE — Progress Notes (Addendum)
 "  Established Patient Office Visit  Subjective   Patient ID: Shari Guzman, female    DOB: 05/23/1986  Age: 39 y.o. MRN: [id number]  Chief Complaint  Patient presents with   Diarrhea    Pt. C/o diarrhea that started early Tuesday AM and feeling nauseated. Pt. Stated of having flu-like symptoms last week. Pt stats pain as a 2.    Diarrhea    Discussed the use of AI scribe software for clinical note transcription with the patient, who gave verbal consent to proceed.  History of Present Illness   Shari Guzman is a 39 year old female who presents with persistent gastrointestinal symptoms following a flu-like illness.  Last week, she experienced flu-like symptoms, including fever, severe body aches, congestion, sore throat, and extreme fatigue, which were debilitating enough to prevent her from working. She did not receive a flu shot this year.  Following the initial illness, she developed gastrointestinal symptoms, including vomiting, stomach cramping, and diarrhea. Vomiting began early Monday morning, with multiple episodes on Tuesday, including at work. She continues to experience diarrhea, with two episodes today. She feels lightheaded and dizzy.  Her nutritional intake has been minimal, consisting of six or seven crackers and one Gatorade over 24 hours. She continues to feel fatigued with decreased energy levels. She also notes her vision is not as clear as usual.  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 She is considering returning to work on Friday if her symptoms improve.  No current nausea but ongoing dizziness, lightheadedness, diarrhea, and fatigue.       Objective:     BP 114/80 (Cuff Size: Normal)   Pulse 78   Temp 98.4 F (36.9 C) (Oral)   Resp 16   Ht 5' 4 (1.626 m)   Wt 273 lb 3.2 oz (123.9 kg)   LMP 10/30/2024 (Approximate)   SpO2 94%    BMI 46.89 kg/m    Physical Exam Vitals and nursing note reviewed.  Constitutional:      Appearance: Normal appearance.  HENT:     Head: Normocephalic and atraumatic.  Eyes:     Conjunctiva/sclera: Conjunctivae normal.  Cardiovascular:     Rate and Rhythm: Normal rate and regular rhythm.  Pulmonary:     Effort: Pulmonary effort is normal.     Breath sounds: Normal breath sounds.  Abdominal:     General: Abdomen is flat. Bowel sounds are increased. There is no distension.     Palpations: Abdomen is soft.  Musculoskeletal:     Right lower leg: No edema.     Left lower leg: No edema.  Skin:    General: Skin is warm and dry.  Neurological:     Mental Status: She is alert and oriented to person, place, and time.  Psychiatric:        Mood and Affect: Mood normal.        Behavior: Behavior normal.        Thought Content: Thought content normal.        Judgment: Judgment normal.          No results found for any visits on 11/13/24.    The ASCVD Risk score (Arnett DK, et al., 2019) failed to calculate for the following reasons:   The 2019 ASCVD risk score is only valid for ages 3 to 69  Assessment & Plan:  Nausea Assessment & Plan: Ondansetron  8 mg every 8 hours for nausea.  When she can keep fluids down please alternate liquid IV with plain water for hydration.  Orders: -     Ondansetron ; Take 1 tablet (8 mg total) by mouth every 8 (eight) hours as needed for nausea.  Dispense: 20 tablet; Refill: 0  Gastroenteritis Assessment & Plan: Acute gastroenteritis with dehydration.  Symptoms started after a flulike illness.  She has vomiting and diarrhea and likely is dehydrated.  Ask her to follow the BRAT diet (bananas, rice, applesauce, toast to help with her diarrhea.  Advised rest and avoidance of work until dizziness resolves.      Return if symptoms worsen or fail to improve.    Talib Headley K Zamira Hickam, MD "

## 2024-11-14 ENCOUNTER — Encounter: Payer: Self-pay | Admitting: Family Medicine

## 2024-11-14 ENCOUNTER — Telehealth: Payer: Self-pay | Admitting: Family Medicine

## 2024-11-14 NOTE — Telephone Encounter (Signed)
 Just called to check on her and see if she was able to get fluids last night.  LM that I called.

## 2024-11-15 NOTE — Telephone Encounter (Signed)
 Refill was sent in on 12/30.  Nothing further needed.

## 2024-11-21 ENCOUNTER — Other Ambulatory Visit: Payer: Self-pay | Admitting: Family Medicine

## 2024-11-21 DIAGNOSIS — K529 Noninfective gastroenteritis and colitis, unspecified: Secondary | ICD-10-CM

## 2024-11-21 MED ORDER — PANTOPRAZOLE SODIUM 20 MG PO TBEC: 20.0000 mg | DELAYED_RELEASE_TABLET | Freq: Two times a day (BID) | ORAL | 1 refills | Status: AC

## 2024-11-27 ENCOUNTER — Other Ambulatory Visit: Payer: Self-pay | Admitting: Family Medicine

## 2024-11-27 MED ORDER — TIRZEPATIDE-WEIGHT MANAGEMENT 2.5 MG/0.5ML ~~LOC~~ SOLN: 2.5000 mg | SUBCUTANEOUS | 2 refills | Status: AC
